# Patient Record
Sex: Female | Born: 1970 | Race: White | Hispanic: No | Smoking: Never smoker
Health system: Southern US, Community
[De-identification: ages and names within clinical notes are randomized; demographics above are authoritative.]

## PROBLEM LIST (undated history)

## (undated) DIAGNOSIS — F419 Anxiety disorder, unspecified: Secondary | ICD-10-CM

## (undated) HISTORY — PX: APPENDECTOMY: SHX54

## (undated) HISTORY — PX: BREAST ENHANCEMENT SURGERY: SHX7

---

## 1998-09-29 ENCOUNTER — Emergency Department (HOSPITAL_COMMUNITY): Admission: EM | Admit: 1998-09-29 | Discharge: 1998-09-29 | Payer: Self-pay | Admitting: Internal Medicine

## 1999-01-23 ENCOUNTER — Ambulatory Visit (HOSPITAL_COMMUNITY): Admission: RE | Admit: 1999-01-23 | Discharge: 1999-01-23 | Payer: Self-pay | Admitting: Obstetrics and Gynecology

## 1999-01-23 ENCOUNTER — Encounter: Payer: Self-pay | Admitting: Obstetrics and Gynecology

## 1999-06-12 ENCOUNTER — Inpatient Hospital Stay (HOSPITAL_COMMUNITY): Admission: AD | Admit: 1999-06-12 | Discharge: 1999-06-12 | Payer: Self-pay | Admitting: Obstetrics and Gynecology

## 1999-06-19 ENCOUNTER — Inpatient Hospital Stay (HOSPITAL_COMMUNITY): Admission: AD | Admit: 1999-06-19 | Discharge: 1999-06-19 | Payer: Self-pay | Admitting: Obstetrics and Gynecology

## 1999-06-24 ENCOUNTER — Inpatient Hospital Stay (HOSPITAL_COMMUNITY): Admission: AD | Admit: 1999-06-24 | Discharge: 1999-06-26 | Payer: Self-pay | Admitting: Obstetrics and Gynecology

## 1999-06-25 ENCOUNTER — Encounter (INDEPENDENT_AMBULATORY_CARE_PROVIDER_SITE_OTHER): Payer: Self-pay | Admitting: Specialist

## 2000-03-17 ENCOUNTER — Other Ambulatory Visit: Admission: RE | Admit: 2000-03-17 | Discharge: 2000-03-17 | Payer: Self-pay | Admitting: Gynecology

## 2001-04-14 ENCOUNTER — Other Ambulatory Visit: Admission: RE | Admit: 2001-04-14 | Discharge: 2001-04-14 | Payer: Self-pay | Admitting: Gynecology

## 2002-04-26 ENCOUNTER — Other Ambulatory Visit: Admission: RE | Admit: 2002-04-26 | Discharge: 2002-04-26 | Payer: Self-pay | Admitting: Gynecology

## 2002-05-08 ENCOUNTER — Encounter: Admission: RE | Admit: 2002-05-08 | Discharge: 2002-05-08 | Payer: Self-pay | Admitting: Sports Medicine

## 2003-07-01 ENCOUNTER — Other Ambulatory Visit: Admission: RE | Admit: 2003-07-01 | Discharge: 2003-07-01 | Payer: Self-pay | Admitting: Gynecology

## 2004-07-07 ENCOUNTER — Other Ambulatory Visit: Admission: RE | Admit: 2004-07-07 | Discharge: 2004-07-07 | Payer: Self-pay | Admitting: Gynecology

## 2005-01-01 ENCOUNTER — Ambulatory Visit: Payer: Self-pay | Admitting: Sports Medicine

## 2005-01-19 ENCOUNTER — Ambulatory Visit: Payer: Self-pay | Admitting: Sports Medicine

## 2005-04-22 ENCOUNTER — Ambulatory Visit: Payer: Self-pay | Admitting: Sports Medicine

## 2005-05-04 ENCOUNTER — Ambulatory Visit: Payer: Self-pay | Admitting: Sports Medicine

## 2005-08-26 ENCOUNTER — Other Ambulatory Visit: Admission: RE | Admit: 2005-08-26 | Discharge: 2005-08-26 | Payer: Self-pay | Admitting: Gynecology

## 2006-08-30 ENCOUNTER — Other Ambulatory Visit: Admission: RE | Admit: 2006-08-30 | Discharge: 2006-08-30 | Payer: Self-pay | Admitting: Gynecology

## 2007-12-19 ENCOUNTER — Other Ambulatory Visit: Admission: RE | Admit: 2007-12-19 | Discharge: 2007-12-19 | Payer: Self-pay | Admitting: Gynecology

## 2011-02-06 ENCOUNTER — Emergency Department (HOSPITAL_COMMUNITY)
Admission: EM | Admit: 2011-02-06 | Discharge: 2011-02-06 | Disposition: A | Discharge status: Home / Self Care | Payer: Self-pay | Attending: Emergency Medicine | Admitting: Emergency Medicine

## 2011-02-06 ENCOUNTER — Emergency Department (HOSPITAL_COMMUNITY): Payer: Self-pay

## 2011-02-06 DIAGNOSIS — R002 Palpitations: Secondary | ICD-10-CM | POA: Insufficient documentation

## 2011-02-06 DIAGNOSIS — R110 Nausea: Secondary | ICD-10-CM | POA: Insufficient documentation

## 2011-02-06 DIAGNOSIS — R079 Chest pain, unspecified: Secondary | ICD-10-CM | POA: Insufficient documentation

## 2011-02-06 DIAGNOSIS — R5381 Other malaise: Secondary | ICD-10-CM | POA: Insufficient documentation

## 2011-02-06 LAB — POCT CARDIAC MARKERS
CKMB, poc: <1 ng/mL — ABNORMAL LOW (ref 1.0–8.0)
Myoglobin, poc: 53.5 ng/mL (ref 12–200)
Troponin i, poc: <0.05 ng/mL (ref 0.00–0.09)

## 2011-02-06 LAB — POCT I-STAT, CHEM 8
BUN: 18 mg/dL (ref 6–23)
Calcium, Ion: 1.15 mmol/L (ref 1.12–1.32)
Chloride: 109 mEq/L (ref 96–112)
Creatinine, Ser: 0.9 mg/dL (ref 0.4–1.2)
Glucose, Bld: 91 mg/dL (ref 70–99)
HCT: 46 % (ref 36.0–46.0)
Hemoglobin: 15.6 g/dL — ABNORMAL HIGH (ref 12.0–15.0)
Potassium: 3.8 mEq/L (ref 3.5–5.1)
Sodium: 140 mEq/L (ref 135–145)
TCO2: 21 mmol/L (ref 0–100)

## 2011-02-06 LAB — POCT PREGNANCY, URINE: Preg Test, Ur: NEGATIVE

## 2011-02-06 LAB — URINALYSIS, ROUTINE W REFLEX MICROSCOPIC
Ketones, ur: NEGATIVE mg/dL
Nitrite: NEGATIVE
Protein, ur: NEGATIVE mg/dL
Urobilinogen, UA: 0.2 mg/dL (ref 0.0–1.0)

## 2011-02-06 LAB — COMPREHENSIVE METABOLIC PANEL
Albumin: 3.8 g/dL (ref 3.5–5.2)
BUN: 18 mg/dL (ref 6–23)
Calcium: 9.2 mg/dL (ref 8.4–10.5)
Chloride: 108 mEq/L (ref 96–112)
Creatinine, Ser: 0.81 mg/dL (ref 0.4–1.2)
Total Bilirubin: 0.8 mg/dL (ref 0.3–1.2)

## 2011-02-06 LAB — D-DIMER, QUANTITATIVE: D-Dimer, Quant: 0.26 ug/mL-FEU (ref 0.00–0.48)

## 2011-02-07 ENCOUNTER — Emergency Department (HOSPITAL_COMMUNITY)
Admission: EM | Admit: 2011-02-07 | Discharge: 2011-02-07 | Disposition: A | Discharge status: Home / Self Care | Payer: Self-pay | Attending: Emergency Medicine | Admitting: Emergency Medicine

## 2011-02-07 DIAGNOSIS — R002 Palpitations: Secondary | ICD-10-CM | POA: Insufficient documentation

## 2011-02-07 DIAGNOSIS — I4949 Other premature depolarization: Secondary | ICD-10-CM | POA: Insufficient documentation

## 2011-02-07 DIAGNOSIS — R072 Precordial pain: Secondary | ICD-10-CM | POA: Insufficient documentation

## 2011-02-07 DIAGNOSIS — Z79899 Other long term (current) drug therapy: Secondary | ICD-10-CM | POA: Insufficient documentation

## 2011-02-07 DIAGNOSIS — F411 Generalized anxiety disorder: Secondary | ICD-10-CM | POA: Insufficient documentation

## 2011-02-07 LAB — TSH: TSH: 1.03 u[IU]/mL (ref 0.350–4.500)

## 2011-02-07 LAB — T4, FREE: Free T4: 1.07 ng/dL (ref 0.80–1.80)

## 2011-02-09 ENCOUNTER — Institutional Professional Consult (permissible substitution): Payer: Self-pay | Admitting: Cardiology

## 2011-11-29 ENCOUNTER — Encounter: Payer: Self-pay | Admitting: Family Medicine

## 2011-11-29 ENCOUNTER — Ambulatory Visit (HOSPITAL_BASED_OUTPATIENT_CLINIC_OR_DEPARTMENT_OTHER)
Admission: RE | Admit: 2011-11-29 | Discharge: 2011-11-29 | Disposition: A | Discharge status: Home / Self Care | Payer: BC Managed Care – PPO | Source: Ambulatory Visit | Attending: Family Medicine | Admitting: Family Medicine

## 2011-11-29 ENCOUNTER — Ambulatory Visit (INDEPENDENT_AMBULATORY_CARE_PROVIDER_SITE_OTHER): Payer: BC Managed Care – PPO | Admitting: Family Medicine

## 2011-11-29 VITALS — BP 115/72 | HR 75 | Temp 98.2°F | Ht 66.0 in | Wt 135.0 lb

## 2011-11-29 DIAGNOSIS — M25511 Pain in right shoulder: Secondary | ICD-10-CM

## 2011-11-29 DIAGNOSIS — M25519 Pain in unspecified shoulder: Secondary | ICD-10-CM

## 2011-11-29 NOTE — Progress Notes (Signed)
  Subjective:    Patient ID: Linda Robertson, female    DOB: Jan 07, 1971, 40 y.o.   MRN: [id number]  HPI 40 yo F here for right shoulder pain.  Patient denies known injury. States pain started on 11/29 during work. Worsened over that day to the point she couldn't lift her arm from desk because of severe pain. Did not do any heavy lifting or anything out of the ordinary that day. Since then is 9/10 pain. Tried icing, heating pad, advil without much help. Is waking up at night. Reports a nagging ache in this shoulder for several years. Uses left hand more than right at work though. No radiation. No numbness/tingling. No remote right shoulder injuries she can recall.  History reviewed. No pertinent past medical history.  No current outpatient prescriptions on file prior to visit.    Past Surgical History  Procedure Date  . Appendectomy   . Breast enhancement surgery     Allergies  Allergen Reactions  . Codeine     History   Social History  . Marital Status: Single    Spouse Name: N/A    Number of Children: N/A  . Years of Education: N/A   Occupational History  . Not on file.   Social History Main Topics  . Smoking status: Never Smoker   . Smokeless tobacco: Not on file  . Alcohol Use: Not on file  . Drug Use: Not on file  . Sexually Active: Not on file   Other Topics Concern  . Not on file   Social History Narrative  . No narrative on file    Family History  Problem Relation Age of Onset  . Heart attack Father   . Sudden death Father   . Heart attack Maternal Grandmother   . Heart attack Maternal Grandfather   . Heart attack Paternal Grandmother   . Diabetes Neg Hx   . Hyperlipidemia Neg Hx   . Hypertension Neg Hx     BP 115/72  Pulse 75  Temp(Src) 98.2 F (36.8 C) (Oral)  Ht 5\' 6"  (1.676 m)  Wt 135 lb (61.236 kg)  BMI 21.79 kg/m2  LMP 11/08/2011  Review of Systems See HPI.    Objective:   Physical Exam Gen: NAD  R shoulder: No  swelling, ecchymoses.  No gross deformity. TTP lateral right shoulder and anteriorly but not at [facility] joint or biceps tendon. Very limited ROM including passive ER - only 20 degrees and painful.  AROM limited to 20 ER, 60 abd/flex with 100 passive flex, 80 abd. Positive hawkins, neers. Negative Yergasons. Unable to adequately perform empty can.  Pain with this and resisted IR and ER - decreased strength in all 2/2 pain. NV intact distally.     Assessment & Plan:  1. Right shoulder pain - x-rays show calcific tendinopathy - exam also indicates developing severe adhesive capsulitis.  Without injury highly unlikely she has rotator cuff tear.  Start with conservative care - combination subacromial/glenohumeral injection given today.  Start codman exercises.  Heat to help regain motion as well.  Consider PT in future.  After informed written consent, patient was seated on exam table. Right shoulder was prepped with alcohol swab and utilizing posterior approach, patient's right shoulder was injected with 6:2 marcaine:depomedrol with half in the subacromial space and half in glenohumeral space.  Patient tolerated the procedure well without immediate complications.

## 2011-11-29 NOTE — Patient Instructions (Signed)
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 Limit lifting and overhead activities as much as possible. Heat 15 minutes at a time 3-4 times a day may help with movement and stiffness. Tylenol and/or aleve as needed for pain and inflammation. Steroid injections in a series have been shown to help with pain and motion. Codman exercises (pendulum, wall walking or table slides, arm circles) - do 3 sets of 10 daily of each of these. Physical therapy for rotator cuff strengthening is a consideration once you are out of the painful phase Follow up in 4 weeks with [place] or in [place]. Call me if you don't notice a benefit in a week - we could try passive motion and modalities in PT or nitro patches - I wouldn't repeat a cortisone injection this soon though.

## 2011-11-29 NOTE — Assessment & Plan Note (Signed)
x-rays show calcific tendinopathy - exam also indicates developing severe adhesive capsulitis.  Without injury highly unlikely she has rotator cuff tear.  Start with conservative care - combination subacromial/glenohumeral injection given today.  Start codman exercises.  Heat to help regain motion as well.  Consider PT in future.  After informed written consent, patient was seated on exam table. Right shoulder was prepped with alcohol swab and utilizing posterior approach, patient's right shoulder was injected with 6:2 marcaine:depomedrol with half in the subacromial space and half in glenohumeral space.  Patient tolerated the procedure well without immediate complications.

## 2011-12-06 ENCOUNTER — Ambulatory Visit (INDEPENDENT_AMBULATORY_CARE_PROVIDER_SITE_OTHER): Payer: BC Managed Care – PPO | Admitting: Family Medicine

## 2011-12-06 ENCOUNTER — Ambulatory Visit (HOSPITAL_BASED_OUTPATIENT_CLINIC_OR_DEPARTMENT_OTHER)
Admission: RE | Admit: 2011-12-06 | Discharge: 2011-12-06 | Disposition: A | Discharge status: Home / Self Care | Payer: BC Managed Care – PPO | Source: Ambulatory Visit | Attending: Family Medicine | Admitting: Family Medicine

## 2011-12-06 ENCOUNTER — Encounter: Payer: Self-pay | Admitting: Family Medicine

## 2011-12-06 VITALS — BP 119/62 | HR 74 | Temp 98.3°F | Ht 66.0 in | Wt 135.0 lb

## 2011-12-06 DIAGNOSIS — S99919A Unspecified injury of unspecified ankle, initial encounter: Secondary | ICD-10-CM

## 2011-12-06 DIAGNOSIS — S99912A Unspecified injury of left ankle, initial encounter: Secondary | ICD-10-CM | POA: Insufficient documentation

## 2011-12-06 DIAGNOSIS — X58XXXA Exposure to other specified factors, initial encounter: Secondary | ICD-10-CM | POA: Insufficient documentation

## 2011-12-06 DIAGNOSIS — S82899A Other fracture of unspecified lower leg, initial encounter for closed fracture: Secondary | ICD-10-CM | POA: Insufficient documentation

## 2011-12-06 DIAGNOSIS — S99929A Unspecified injury of unspecified foot, initial encounter: Secondary | ICD-10-CM

## 2011-12-06 NOTE — Progress Notes (Signed)
  Subjective:    Patient ID: Linda Robertson, female    DOB: 12-18-1971, 40 y.o.   MRN: [id number]  Ankle Injury    40 yo F here for left ankle injury   Patient reports on 12/8 she was at her office christmas party. While leaving she states she acutely turned her ankle (unsure which direction) while wearing heels. Unable to bear weight immediately after. Had pain, swelling, bruising develop laterally.  No medial pain. Has history of ankle sprains remotely. Has tried icing, elevating, advil. Can walk better today but still with difficulty.  History reviewed. No pertinent past medical history.  Current Outpatient Prescriptions on File Prior to Visit  Medication Sig Dispense Refill  . ALPRAZolam (XANAX) 0.25 MG tablet Take 0.25 mg by mouth at bedtime as needed.        . sertraline (ZOLOFT) 50 MG tablet Take 50 mg by mouth daily.          Past Surgical History  Procedure Date  . Appendectomy   . Breast enhancement surgery     Allergies  Allergen Reactions  . Codeine     History   Social History  . Marital Status: Single    Spouse Name: N/A    Number of Children: N/A  . Years of Education: N/A   Occupational History  . Not on file.   Social History Main Topics  . Smoking status: Never Smoker   . Smokeless tobacco: Not on file  . Alcohol Use: Not on file  . Drug Use: Not on file  . Sexually Active: Not on file   Other Topics Concern  . Not on file   Social History Narrative  . No narrative on file    Family History  Problem Relation Age of Onset  . Heart attack Father   . Sudden death Father   . Heart attack Maternal Grandmother   . Heart attack Maternal Grandfather   . Heart attack Paternal Grandmother   . Diabetes Neg Hx   . Hyperlipidemia Neg Hx   . Hypertension Neg Hx     BP 119/62  Pulse 74  Temp(Src) 98.3 F (36.8 C) (Oral)  Ht 5\' 6"  (1.676 m)  Wt 135 lb (61.236 kg)  BMI 21.79 kg/m2  LMP 11/08/2011  Review of Systems  See HPI.      Objective:   Physical Exam  Gen: NAD  L ankle: Moderate swelling, ecchymoses mostly laterally.  No warmth. Moderate limitation in all planes 2/2 pain and swelling. TTP at ATFL and lateral malleolus.  No medial malleolus, fibular head, navicular, base 5th, other TTP about foot or ankle.  No TTP over deltoid ligament medially. 1+ ant drawer, painful.  1+ talar tilt. Lateral pain with syndesmotic compression. Thompsons test negative. NV intact distally.     Assessment & Plan:  1. Left ankle injury - radiographs show a distal fibula fracture above level of ankle joint consistent with a Weber C type fracture.  This is very commonly associated with syndesmotic dysruption, deltoid ligament tear but patient does not have medial pain and her mortise appears intact.  Patient placed in walking boot and to use crutches with touch-down weight bearing over next 7-10 days.  Will follow-up in office, repeat radiographs and if mortise intact on these, will do a medial stress view.  Have a low threshold for orthopedic surgeon referral given high incidence of associated injuries with this type of fracture that would require ORIF.

## 2011-12-06 NOTE — Assessment & Plan Note (Signed)
radiographs show a distal fibula fracture above level of ankle joint consistent with a Weber C type fracture.  This is very commonly associated with syndesmotic dysruption, deltoid ligament tear but patient does not have medial pain and her mortise appears intact.  Patient placed in walking boot and to use crutches with touch-down weight bearing over next 7-10 days.  Will follow-up in office, repeat radiographs and if mortise intact on these, will do a medial stress view.  Have a low threshold for orthopedic surgeon referral given high incidence of associated injuries with this type of fracture that would require ORIF.

## 2011-12-06 NOTE — Patient Instructions (Signed)
You have an isolated oblique distal fibula fracture though your ankle joint appears maintained. We will have to keep a close watch on this because of the fracture's location above the ankle joint. Wear walking boot except when icing, bathing (be VERY careful), driving - though do not push pedal with your left leg. ACE wrap under walking boot can help with compression as well. Elevate above the level of your heart as much as possible. Use crutches with touch-down weight bearing of your left ankle. Follow up with me in 7-10 days - we will repeat x-rays at that time.

## 2011-12-07 MED ORDER — DICLOFENAC SODIUM 75 MG PO TBEC: 75.0000 mg | DELAYED_RELEASE_TABLET | Freq: Two times a day (BID) | ORAL | Status: DC

## 2011-12-13 ENCOUNTER — Encounter: Payer: Self-pay | Admitting: Family Medicine

## 2011-12-13 ENCOUNTER — Ambulatory Visit (INDEPENDENT_AMBULATORY_CARE_PROVIDER_SITE_OTHER): Payer: BC Managed Care – PPO | Admitting: Family Medicine

## 2011-12-13 ENCOUNTER — Ambulatory Visit (INDEPENDENT_AMBULATORY_CARE_PROVIDER_SITE_OTHER)
Admission: RE | Admit: 2011-12-13 | Discharge: 2011-12-13 | Disposition: A | Discharge status: Home / Self Care | Payer: BC Managed Care – PPO | Source: Ambulatory Visit | Attending: Family Medicine | Admitting: Family Medicine

## 2011-12-13 ENCOUNTER — Ambulatory Visit (HOSPITAL_BASED_OUTPATIENT_CLINIC_OR_DEPARTMENT_OTHER)
Admission: RE | Admit: 2011-12-13 | Discharge: 2011-12-13 | Disposition: A | Discharge status: Home / Self Care | Payer: BC Managed Care – PPO | Source: Ambulatory Visit | Attending: Family Medicine | Admitting: Family Medicine

## 2011-12-13 ENCOUNTER — Other Ambulatory Visit: Payer: Self-pay | Admitting: Family Medicine

## 2011-12-13 VITALS — BP 115/81 | HR 86 | Temp 97.4°F | Ht 66.0 in | Wt 135.0 lb

## 2011-12-13 DIAGNOSIS — S8990XA Unspecified injury of unspecified lower leg, initial encounter: Secondary | ICD-10-CM

## 2011-12-13 DIAGNOSIS — M25579 Pain in unspecified ankle and joints of unspecified foot: Secondary | ICD-10-CM

## 2011-12-13 DIAGNOSIS — M25572 Pain in left ankle and joints of left foot: Secondary | ICD-10-CM

## 2011-12-13 DIAGNOSIS — R52 Pain, unspecified: Secondary | ICD-10-CM

## 2011-12-13 DIAGNOSIS — X58XXXA Exposure to other specified factors, initial encounter: Secondary | ICD-10-CM

## 2011-12-13 DIAGNOSIS — IMO0001 Reserved for inherently not codable concepts without codable children: Secondary | ICD-10-CM | POA: Insufficient documentation

## 2011-12-13 DIAGNOSIS — S82899A Other fracture of unspecified lower leg, initial encounter for closed fracture: Secondary | ICD-10-CM

## 2011-12-13 DIAGNOSIS — Z4789 Encounter for other orthopedic aftercare: Secondary | ICD-10-CM

## 2011-12-13 DIAGNOSIS — S99912A Unspecified injury of left ankle, initial encounter: Secondary | ICD-10-CM

## 2011-12-13 NOTE — Assessment & Plan Note (Signed)
repeat radiographs with medial stress views show no evidence of displacement or medial or syndesmotic disruption.  Still consistent with Weber C fracture and needs close follow-up but without mortise disruption, should do well with conservative treatment.  Continue walking boot and crutches.  Icing, voltaren, tylenol as needed.  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

## 2011-12-13 NOTE — Patient Instructions (Signed)
Your fracture looks good - no further displacement and stress views do not show tearing of the ligaments on the inside of the ankle. Continue voltaren as needed - can take tylenol in addition to this. Half days at work for next 2 weeks to allow relative rest of this. Can take boot off to sleep if not painful, to ice, and to bathe as long as you're careful. Follow up with me in 2 weeks for recheck and repeat x-rays. Call me if you need something stronger for pain.

## 2011-12-13 NOTE — Progress Notes (Signed)
Subjective:    Patient ID: Linda Robertson, female    DOB: 29-Nov-1971, 40 y.o.   MRN: [id number]  Ankle Injury    40 yo F here for f/u left ankle injury   12/10: Patient reports on 12/8 she was at her office christmas party. While leaving she states she acutely turned her ankle (unsure which direction) while wearing heels. Unable to bear weight immediately after. Had pain, swelling, bruising develop laterally.  No medial pain. Has history of ankle sprains remotely. Has tried icing, elevating, advil. Can walk better today but still with difficulty.  12/17: Patient reports pain has improved. Swelling is down in the morning but worse as day goes on, associated with bruising as well. Throbs more at nighttime when on her feet. Difficulty bearing weight comfortably at end of day. Using cam walker and crutches without difficulty. Took some voltaren but  Not taking regularly now.  History reviewed. No pertinent past medical history.  Current Outpatient Prescriptions on File Prior to Visit  Medication Sig Dispense Refill  . ALPRAZolam (XANAX) 0.25 MG tablet Take 0.25 mg by mouth at bedtime as needed.        . diclofenac (VOLTAREN) 75 MG EC tablet Take 1 tablet (75 mg total) by mouth 2 (two) times daily with a meal.  60 tablet  1  . sertraline (ZOLOFT) 50 MG tablet Take 50 mg by mouth daily.          Past Surgical History  Procedure Date  . Appendectomy   . Breast enhancement surgery     Allergies  Allergen Reactions  . Codeine     History   Social History  . Marital Status: Single    Spouse Name: N/A    Number of Children: N/A  . Years of Education: N/A   Occupational History  . Not on file.   Social History Main Topics  . Smoking status: Never Smoker   . Smokeless tobacco: Not on file  . Alcohol Use: Not on file  . Drug Use: Not on file  . Sexually Active: Not on file   Other Topics Concern  . Not on file   Social History Narrative  . No narrative on file     Family History  Problem Relation Age of Onset  . Heart attack Father   . Sudden death Father   . Heart attack Maternal Grandmother   . Heart attack Maternal Grandfather   . Heart attack Paternal Grandmother   . Diabetes Neg Hx   . Hyperlipidemia Neg Hx   . Hypertension Neg Hx     BP 115/81  Pulse 86  Temp(Src) 97.4 F (36.3 C) (Oral)  Ht 5\' 6"  (1.676 m)  Wt 135 lb (61.236 kg)  BMI 21.79 kg/m2  LMP 11/08/2011  Review of Systems  See HPI.    Objective:   Physical Exam  Gen: NAD  L ankle: Mild swelling, moderate ecchymoses throughout lower leg from mid-lower leg distally - mostly laterally.  No warmth. Mild limitation in all planes 2/2 pain and swelling. TTP at ATFL < lateral malleolus.  No medial malleolus, fibular head, navicular, base 5th, other TTP about foot or ankle.  No TTP over deltoid ligament medially. 1+ ant drawer, painful.  1+ talar tilt. Lateral pain with syndesmotic compression. Thompsons test negative. NV intact distally.     Assessment & Plan:  1. Left ankle injury - repeat radiographs with medial stress views show no evidence of displacement or medial or syndesmotic disruption.  Still consistent with Weber C fracture and needs close follow-up but without mortise disruption, should do well with conservative treatment.  Continue walking boot and crutches.  Icing, voltaren, tylenol as needed.  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

## 2011-12-14 ENCOUNTER — Ambulatory Visit: Payer: BC Managed Care – PPO | Admitting: Family Medicine

## 2011-12-29 ENCOUNTER — Encounter: Payer: BC Managed Care – PPO | Admitting: Family Medicine

## 2011-12-30 ENCOUNTER — Ambulatory Visit (INDEPENDENT_AMBULATORY_CARE_PROVIDER_SITE_OTHER): Payer: BC Managed Care – PPO | Admitting: Family Medicine

## 2011-12-30 ENCOUNTER — Encounter: Payer: Self-pay | Admitting: Family Medicine

## 2011-12-30 ENCOUNTER — Ambulatory Visit (HOSPITAL_BASED_OUTPATIENT_CLINIC_OR_DEPARTMENT_OTHER)
Admission: RE | Admit: 2011-12-30 | Discharge: 2011-12-30 | Disposition: A | Discharge status: Home / Self Care | Payer: BC Managed Care – PPO | Source: Ambulatory Visit | Attending: Family Medicine | Admitting: Family Medicine

## 2011-12-30 VITALS — BP 110/65 | HR 70 | Temp 97.5°F | Ht 66.0 in | Wt 130.0 lb

## 2011-12-30 DIAGNOSIS — M25579 Pain in unspecified ankle and joints of unspecified foot: Secondary | ICD-10-CM

## 2011-12-30 DIAGNOSIS — M25572 Pain in left ankle and joints of left foot: Secondary | ICD-10-CM

## 2011-12-30 DIAGNOSIS — S99929A Unspecified injury of unspecified foot, initial encounter: Secondary | ICD-10-CM

## 2011-12-30 DIAGNOSIS — S99912A Unspecified injury of left ankle, initial encounter: Secondary | ICD-10-CM

## 2011-12-30 NOTE — Progress Notes (Signed)
Subjective:    Patient ID: Linda Robertson, female    DOB: 07/01/1971, 41 y.o.   MRN: [id number]  Ankle Injury    41 yo F here for f/u left ankle injury   12/10: Patient reports on 12/8 she was at her office christmas party. While leaving she states she acutely turned her ankle (unsure which direction) while wearing heels. Unable to bear weight immediately after. Had pain, swelling, bruising develop laterally.  No medial pain. Has history of ankle sprains remotely. Has tried icing, elevating, advil. Can walk better today but still with difficulty.  12/17: Patient reports pain has improved. Swelling is down in the morning but worse as day goes on, associated with bruising as well. Throbs more at nighttime when on her feet. Difficulty bearing weight comfortably at end of day. Using cam walker and crutches without difficulty. Took some voltaren but  Not taking regularly now.  1/3: Overall improved quite a bit now. No longer using crutches. Still wearing cam walker when ambulatory. Not taking any medication for pain. Tender to touch at fracture site but otherwise no pain. Swelling almost completely resolved as well as bruising. No other complaints.  History reviewed. No pertinent past medical history.  Current Outpatient Prescriptions on File Prior to Visit  Medication Sig Dispense Refill  . ALPRAZolam (XANAX) 0.25 MG tablet Take 0.25 mg by mouth at bedtime as needed.        Marland Kitchen NUVARING 0.12-0.015 MG/24HR vaginal ring       . sertraline (ZOLOFT) 50 MG tablet Take 50 mg by mouth daily.          Past Surgical History  Procedure Date  . Appendectomy   . Breast enhancement surgery     Allergies  Allergen Reactions  . Codeine     History   Social History  . Marital Status: Single    Spouse Name: N/A    Number of Children: N/A  . Years of Education: N/A   Occupational History  . Not on file.   Social History Main Topics  . Smoking status: Never Smoker   .  Smokeless tobacco: Not on file  . Alcohol Use: Not on file  . Drug Use: Not on file  . Sexually Active: Not on file   Other Topics Concern  . Not on file   Social History Narrative  . No narrative on file    Family History  Problem Relation Age of Onset  . Heart attack Father   . Sudden death Father   . Heart attack Maternal Grandmother   . Heart attack Maternal Grandfather   . Heart attack Paternal Grandmother   . Diabetes Neg Hx   . Hyperlipidemia Neg Hx   . Hypertension Neg Hx     BP 110/65  Pulse 70  Temp(Src) 97.5 F (36.4 C) (Oral)  Ht 5\' 6"  (1.676 m)  Wt 130 lb (58.968 kg)  BMI 20.98 kg/m2  Review of Systems  See HPI.    Objective:   Physical Exam  Gen: NAD  L ankle: Minimal swelling, minimal ecchymoses lateral lower leg. Minimal limitation in all planes 2/2 pain and swelling. Mild TTP at lateral malleolus.  No medial malleolus, fibular head, navicular, base 5th, other TTP about foot or ankle.  No TTP over deltoid ligament medially. Ant drawer and talar tilt not tested today. Lateral pain with syndesmotic compression. NV intact distally.    Assessment & Plan:  1. Left ankle injury - repeat radiographs again show no  evidence of syndesmotic or medial disruption.  Some interval healing at fracture site and she has clinically improved as well.  Still believe this will be 6-8 weeks total before healed.  Again discussed needs close follow-up - to return in 2-3 weeks for repeat radiographs and clinical evaluation.  Continue cam walker.  Icing, voltaren, tylenol prn.

## 2011-12-30 NOTE — Assessment & Plan Note (Signed)
repeat radiographs again show no evidence of syndesmotic or medial disruption.  Some interval healing at fracture site and she has clinically improved as well.  Still believe this will be 6-8 weeks total before healed.  Again discussed needs close follow-up - to return in 2-3 weeks for repeat radiographs and clinical evaluation.  Continue cam walker.  Icing, voltaren, tylenol prn.

## 2011-12-30 NOTE — Progress Notes (Signed)
Patient ID: Linda Robertson, female   DOB: [birth year], 41 y.o.   MRN: [id number] Note: Patient did not arrive for this visit - encounter opened in error.

## 2012-01-17 ENCOUNTER — Ambulatory Visit (INDEPENDENT_AMBULATORY_CARE_PROVIDER_SITE_OTHER): Payer: BC Managed Care – PPO | Admitting: Family Medicine

## 2012-01-17 ENCOUNTER — Encounter: Payer: Self-pay | Admitting: Family Medicine

## 2012-01-17 ENCOUNTER — Ambulatory Visit (HOSPITAL_BASED_OUTPATIENT_CLINIC_OR_DEPARTMENT_OTHER)
Admission: RE | Admit: 2012-01-17 | Discharge: 2012-01-17 | Disposition: A | Discharge status: Home / Self Care | Payer: BC Managed Care – PPO | Source: Ambulatory Visit | Attending: Family Medicine | Admitting: Family Medicine

## 2012-01-17 VITALS — BP 97/67 | HR 69 | Temp 97.4°F | Ht 67.0 in | Wt 130.0 lb

## 2012-01-17 DIAGNOSIS — S99912A Unspecified injury of left ankle, initial encounter: Secondary | ICD-10-CM

## 2012-01-17 DIAGNOSIS — Z09 Encounter for follow-up examination after completed treatment for conditions other than malignant neoplasm: Secondary | ICD-10-CM

## 2012-01-17 DIAGNOSIS — S82899A Other fracture of unspecified lower leg, initial encounter for closed fracture: Secondary | ICD-10-CM

## 2012-01-17 DIAGNOSIS — S8990XA Unspecified injury of unspecified lower leg, initial encounter: Secondary | ICD-10-CM | POA: Insufficient documentation

## 2012-01-17 DIAGNOSIS — S99919A Unspecified injury of unspecified ankle, initial encounter: Secondary | ICD-10-CM

## 2012-01-17 DIAGNOSIS — X58XXXA Exposure to other specified factors, initial encounter: Secondary | ICD-10-CM | POA: Insufficient documentation

## 2012-01-17 NOTE — Progress Notes (Signed)
Subjective:    Patient ID: Linda Robertson, female    DOB: 1971/02/27, 41 y.o.   MRN: [id number]  Ankle Injury    41 yo F here for f/u left ankle injury   12/10: Patient reports on 12/8 she was at her office christmas party. While leaving she states she acutely turned her ankle (unsure which direction) while wearing heels. Unable to bear weight immediately after. Had pain, swelling, bruising develop laterally.  No medial pain. Has history of ankle sprains remotely. Has tried icing, elevating, advil. Can walk better today but still with difficulty.  12/17: Patient reports pain has improved. Swelling is down in the morning but worse as day goes on, associated with bruising as well. Throbs more at nighttime when on her feet. Difficulty bearing weight comfortably at end of day. Using cam walker and crutches without difficulty. Took some voltaren but  Not taking regularly now.  1/3: Overall improved quite a bit now. No longer using crutches. Still wearing cam walker when ambulatory. Not taking any medication for pain. Tender to touch at fracture site but otherwise no pain. Swelling almost completely resolved as well as bruising. No other complaints.  1/21: Patient now over 6 weeks out from lateral malleolus fracture and doing well. No longer using cam walker. Not taking anything for pain. Gets sore at end of day with lots of walking and if presses on area. Also swells up at end of day. Feels much better. Hasn't tried any cardio exercise yet.  History reviewed. No pertinent past medical history.  Current Outpatient Prescriptions on File Prior to Visit  Medication Sig Dispense Refill  . ALPRAZolam (XANAX) 0.25 MG tablet Take 0.25 mg by mouth at bedtime as needed.        Marland Kitchen NUVARING 0.12-0.015 MG/24HR vaginal ring       . sertraline (ZOLOFT) 50 MG tablet Take 50 mg by mouth daily.          Past Surgical History  Procedure Date  . Appendectomy   . Breast enhancement  surgery     Allergies  Allergen Reactions  . Codeine     History   Social History  . Marital Status: Single    Spouse Name: N/A    Number of Children: N/A  . Years of Education: N/A   Occupational History  . Not on file.   Social History Main Topics  . Smoking status: Never Smoker   . Smokeless tobacco: Not on file  . Alcohol Use: Not on file  . Drug Use: Not on file  . Sexually Active: Not on file   Other Topics Concern  . Not on file   Social History Narrative  . No narrative on file    Family History  Problem Relation Age of Onset  . Heart attack Father   . Sudden death Father   . Heart attack Maternal Grandmother   . Heart attack Maternal Grandfather   . Heart attack Paternal Grandmother   . Diabetes Neg Hx   . Hyperlipidemia Neg Hx   . Hypertension Neg Hx     BP 97/67  Pulse 69  Temp(Src) 97.4 F (36.3 C) (Oral)  Ht 5\' 7"  (1.702 m)  Wt 130 lb (58.968 kg)  BMI 20.36 kg/m2  Review of Systems  See HPI.    Objective:   Physical Exam  Gen: NAD  L ankle: Minimal swelling, no ecchymoses lateral lower leg. Minimal limitation in all planes 2/2 pain and swelling. Mild TTP at lateral  malleolus.  No medial malleolus, fibular head, navicular, base 5th, other TTP about foot or ankle.  No TTP over deltoid ligament medially. Ant drawer and talar tilt negative. Lateral pain with syndesmotic compression. NV intact distally.    Assessment & Plan:  1. Left ankle injury - 2/2 Weber C fracture - significantly improved with clinical healing and callus formation on radiographs.  Stopped cam walker.  Not requiring any medications for pain.  Start back with low resistance cycling and swimming over next 1-2 weeks, then add in walking/elliptical if doing well.  No running for at least 2 more weeks (reviewed return to running program today). Ice after activities.  F/u prn.

## 2012-01-17 NOTE — Assessment & Plan Note (Signed)
2/2 Weber C fracture - significantly improved with clinical healing and callus formation on radiographs.  Stopped cam walker.  Not requiring any medications for pain.  Start back with low resistance cycling and swimming over next 1-2 weeks, then add in walking/elliptical if doing well.  No running for at least 2 more weeks (reviewed return to running program today). Ice after activities.  F/u prn.

## 2012-01-17 NOTE — Patient Instructions (Signed)
Your x-rays look great - you have good callus formation at the fracture site. This is not 100% healed though. For the next 1-2 weeks ok to bike and swim for exercises - start with low resistance on the bike. After 1-2 weeks, can add elliptical and walking for exercise. Ok to run in 2 weeks+ if you do ok with the above. Start with walk: jog 1 minute: 1 minute, total 10-15 minutes of exercise - if you do well with this, can increase jog portion by 1-2 minutes and total exercise time by 5 minutes each session. Ice for 15 minutes after working out. Continue home theraband exercises for your shoulder most days of the week. Consider formal physical therapy for this (you can call me if you want to go ahead with this). We could repeat cortisone injection but I'd like to avoid this if we can.

## 2012-09-14 IMAGING — CR DG ANKLE COMPLETE 3+V*L*
3 series · 3 of 3 positions shown · non-contrast
Comparison: None

CLINICAL DATA: Left ankle injury.

LEFT ANKLE COMPLETE - 3+ VIEW

[t ankle joint ap left]
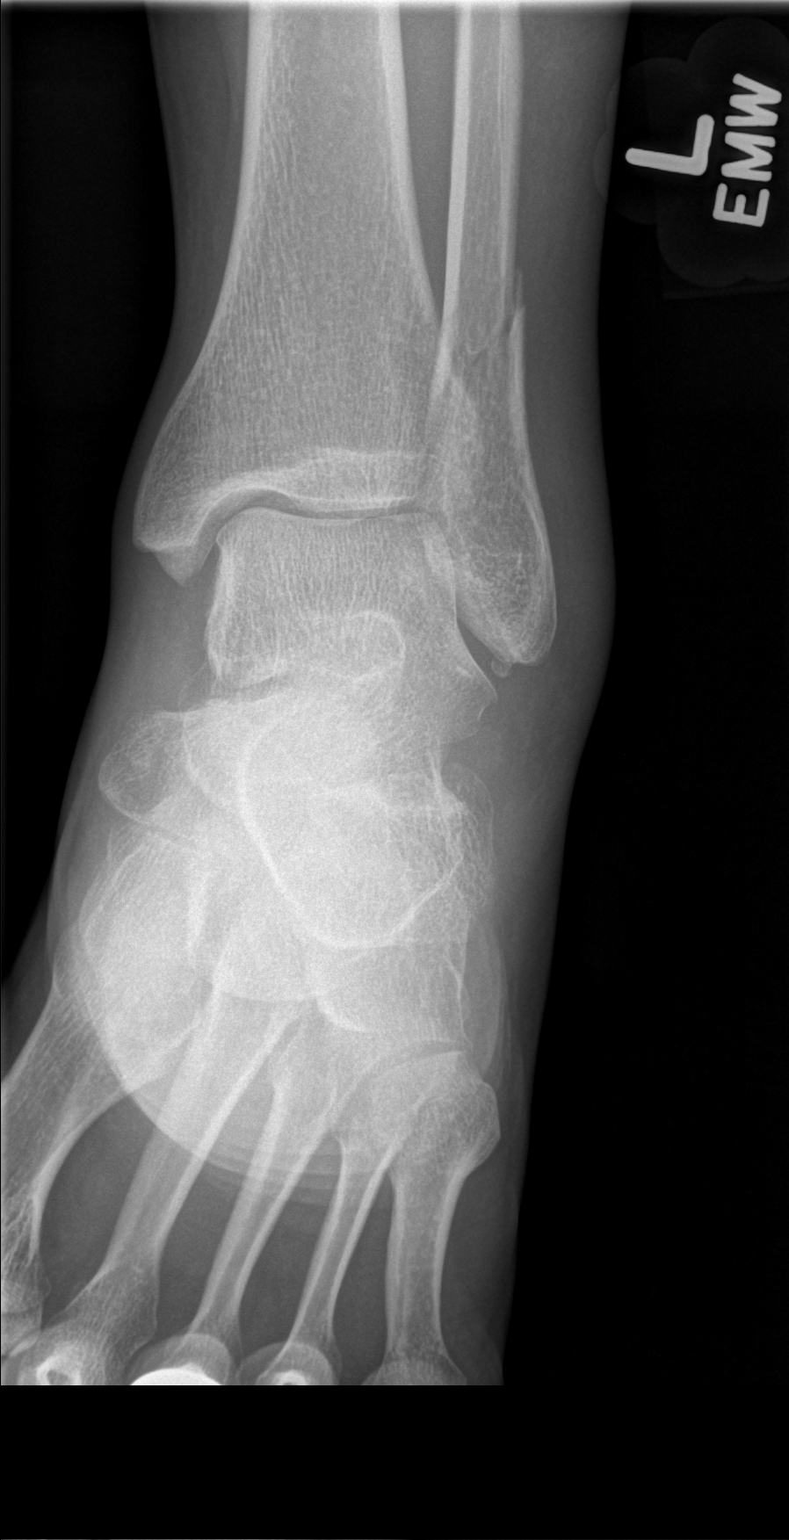

[t ankle joint oblique left]
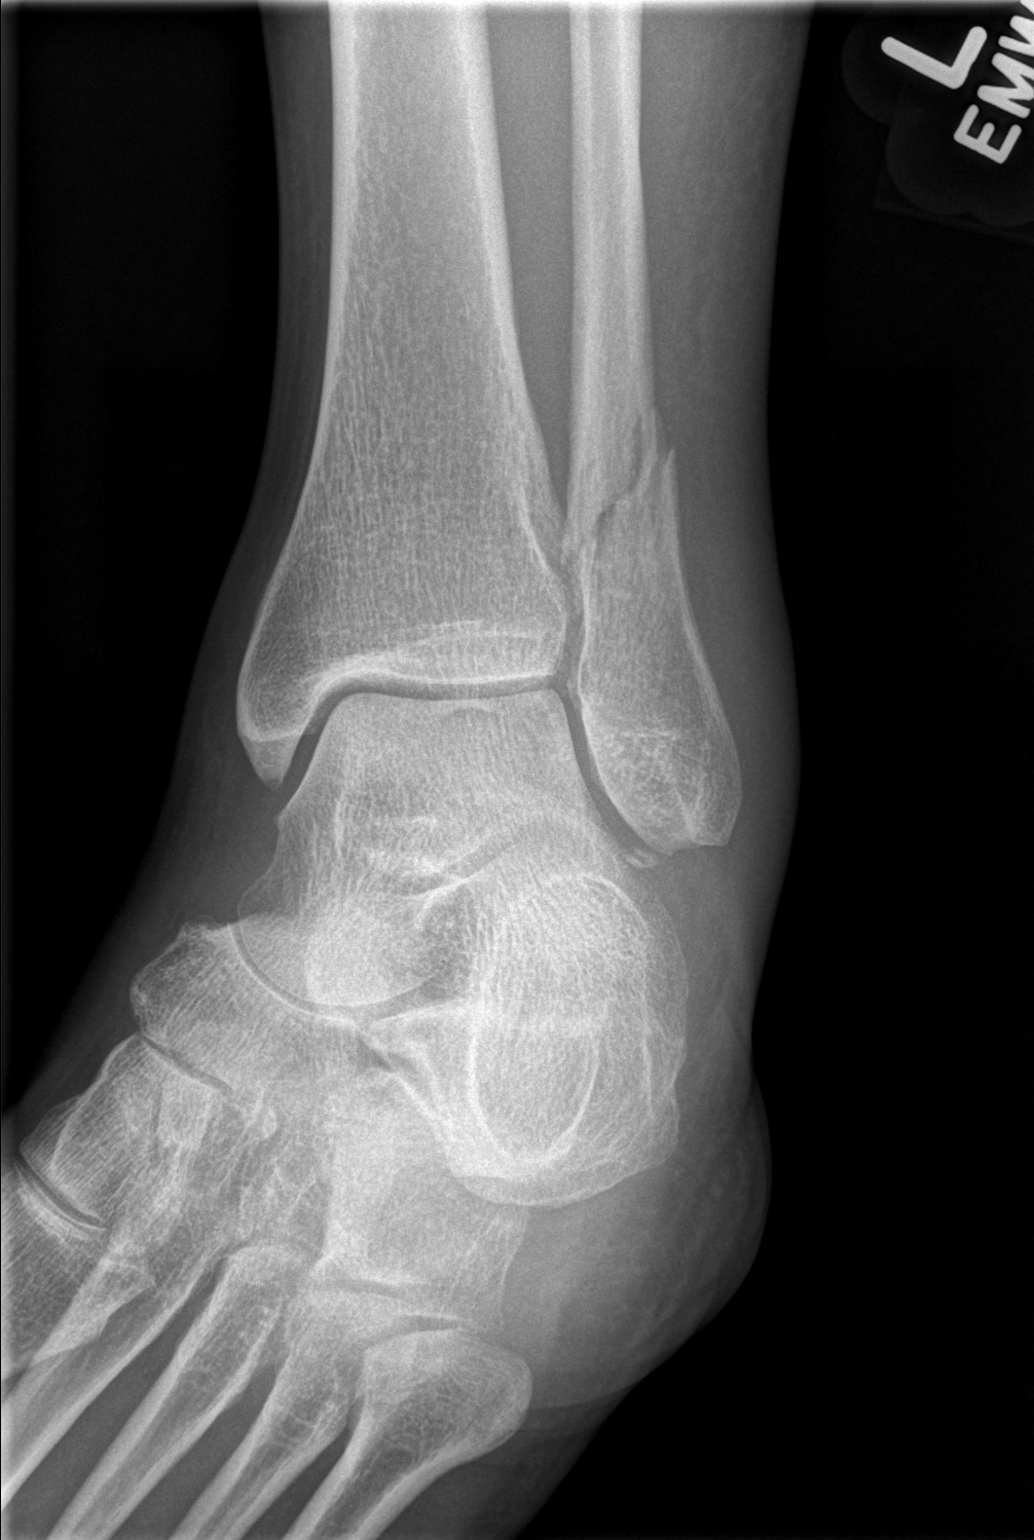

[t ankle joint lat left]
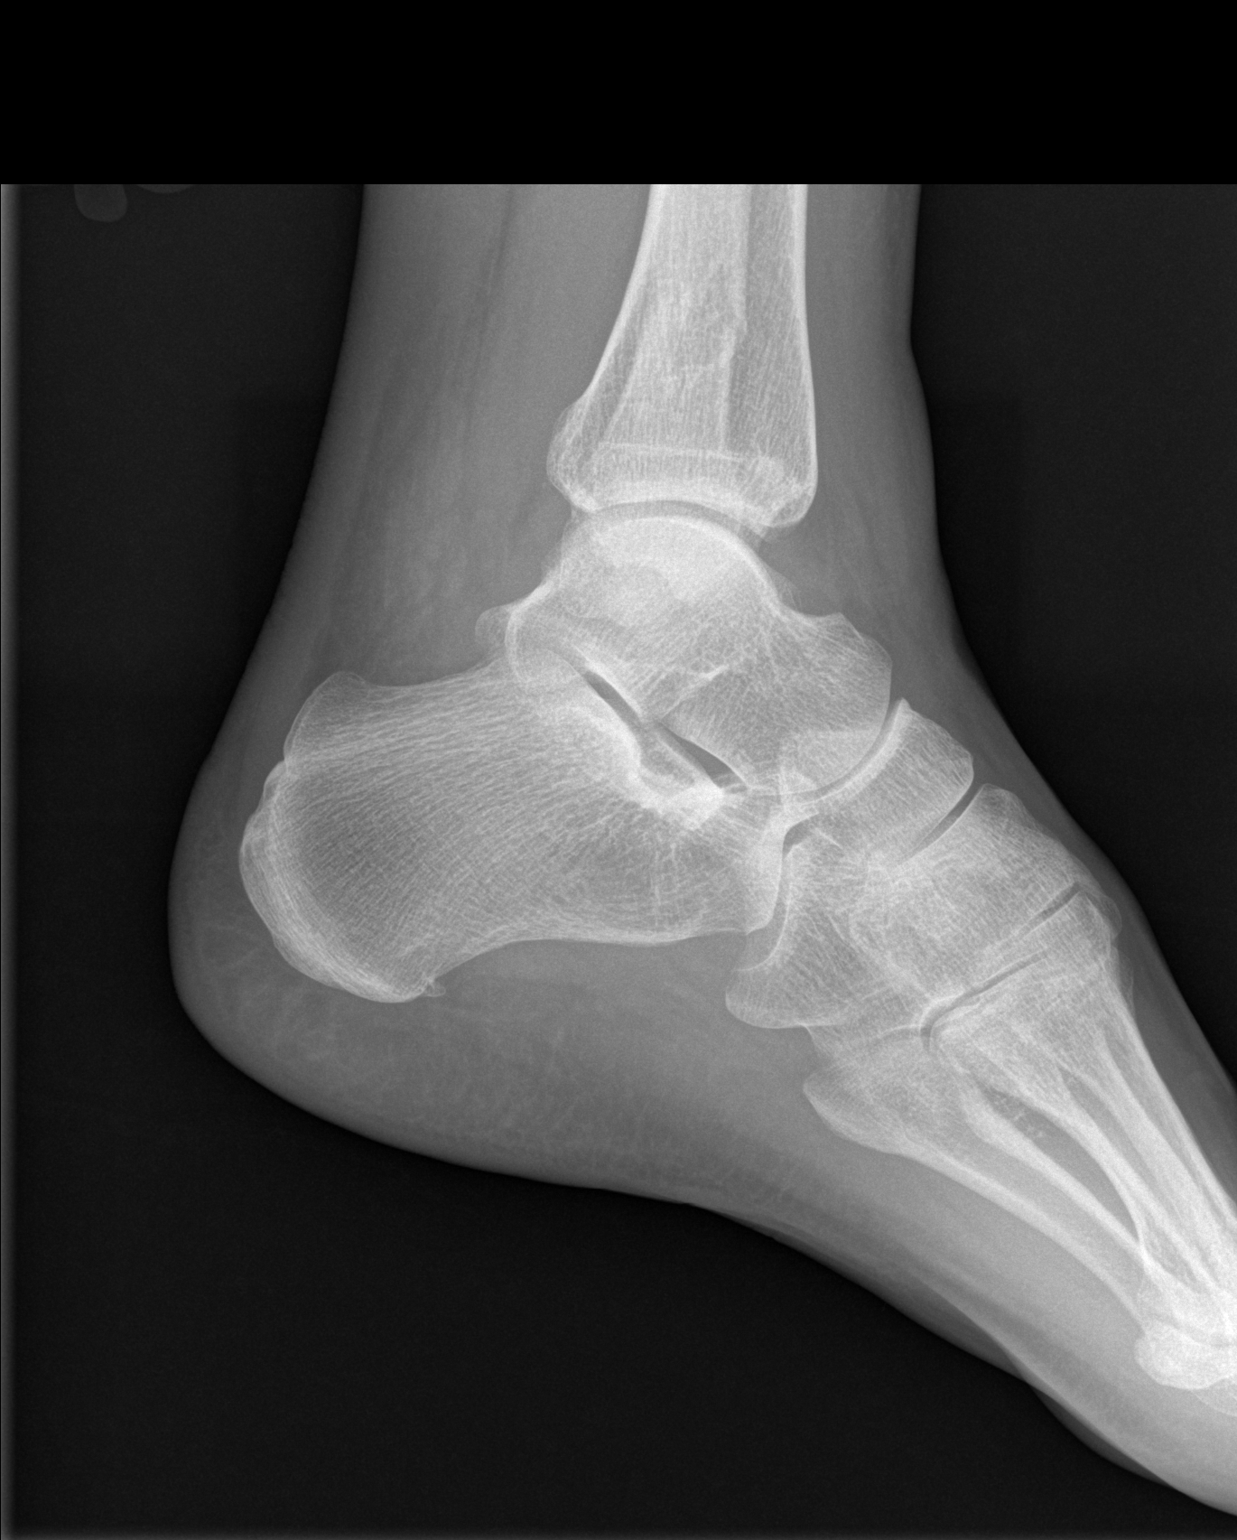

[3 of 3 positions shown; findings below may reference images not displayed]

FINDINGS: The ankle mortise is maintained.  There is an oblique
coursing nondisplaced fracture involving the distal fibula above
the level of the ankle mortise.  Small rounded density near the
distal tip of the lateral malleolus is likely an old avulsion
injury or unfused secondary ossification center.  There is
associated lateral soft tissue swelling.  Visualized mid and hind
foot bony structures appear normal.  A tiny calcaneal heel spurs
noted.
IMPRESSION: Nondisplaced oblique coursing distal fibular fracture above the
level of the ankle mortise (Adriao type C).

## 2014-05-23 ENCOUNTER — Encounter: Payer: Self-pay | Admitting: Emergency Medicine

## 2014-05-23 ENCOUNTER — Emergency Department (INDEPENDENT_AMBULATORY_CARE_PROVIDER_SITE_OTHER): Payer: PRIVATE HEALTH INSURANCE

## 2014-05-23 ENCOUNTER — Emergency Department
Admission: EM | Admit: 2014-05-23 | Discharge: 2014-05-23 | Disposition: A | Discharge status: Home / Self Care | Payer: PRIVATE HEALTH INSURANCE | Source: Home / Self Care | Attending: Emergency Medicine | Admitting: Emergency Medicine

## 2014-05-23 DIAGNOSIS — S20219A Contusion of unspecified front wall of thorax, initial encounter: Secondary | ICD-10-CM

## 2014-05-23 DIAGNOSIS — S20212A Contusion of left front wall of thorax, initial encounter: Secondary | ICD-10-CM

## 2014-05-23 DIAGNOSIS — R079 Chest pain, unspecified: Secondary | ICD-10-CM

## 2014-05-23 HISTORY — DX: Anxiety disorder, unspecified: F41.9

## 2014-05-23 MED ORDER — IBUPROFEN 200 MG PO TABS
ORAL_TABLET | ORAL | Status: DC
Start: 2014-05-23 — End: 2014-08-16

## 2014-05-23 NOTE — ED Provider Notes (Signed)
CSN: [id number]     Arrival date & time 05/23/14  0907 History   First MD Initiated Contact with Patient 05/23/14 [phone]     Chief Complaint  Patient presents with  . Chest Pain    L rib pain   HPI Fell while water skiing 5/24 landing on L side. States "it knocked the breath out of me" .The sharp pain in left anterolateral rib area has gotten gradually worse , exacerbated with deep breaths or coughing. Increased discomfort with movement. No rash or bruising noted. Advil helps some with discomfort. At rest, pain is 2/10. With deep breath, 9/10. If she sneezes, pain is 10 out of 10. Associated symptoms: Denies anterior chest pain or shortness of breath. No lightheadedness or focal neurologic symptoms. Denies abdominal pain or GI symptoms. Denies chance of pregnancy.  Past Medical History  Diagnosis Date  . Anxiety    Past Surgical History  Procedure Laterality Date  . Appendectomy    . Breast enhancement surgery     Family History  Problem Relation Age of Onset  . Heart attack Father   . Sudden death Father   . Heart failure Father   . Heart attack Maternal Grandmother   . Heart attack Maternal Grandfather   . Heart attack Paternal Grandmother   . Diabetes Neg Hx   . Hyperlipidemia Neg Hx    History  Substance Use Topics  . Smoking status: Never Smoker   . Smokeless tobacco: Never Used  . Alcohol Use: Yes     Comment: Social   OB History   Grav Para Term Preterm Abortions TAB SAB Ect Mult Living                 Review of Systems  All other systems reviewed and are negative.   Allergies  Codeine  Home Medications   Prior to Admission medications   Medication Sig Start Date End Date Taking? Authorizing Provider  ALPRAZolam (XANAX) 0.25 MG tablet Take 0.25 mg by mouth at bedtime as needed.      Historical Provider, MD  NUVARING 0.12-0.015 MG/24HR vaginal ring  12/12/11   Historical Provider, MD  sertraline (ZOLOFT) 50 MG tablet Take 50 mg by mouth daily.       Historical Provider, MD   BP 111/70  Pulse 61  Temp(Src) 99 F (37.2 C) (Oral)  Ht 5\' 7"  (1.702 m)  Wt 138 lb 4 oz (62.71 kg)  BMI 21.65 kg/m2  SpO2 100%  LMP 04/10/2014 Physical Exam  Nursing note and vitals reviewed. Constitutional: She is oriented to person, place, and time. She appears well-developed and well-nourished. No distress.  Uncomfortable from left anterior lateral rib pain, but no acute cardiorespiratory distress. Pulse ox 100% on room  HENT:  Head: Normocephalic and atraumatic.  Eyes: Conjunctivae and EOM are normal. Pupils are equal, round, and reactive to light. No scleral icterus.  Neck: Normal range of motion.  Cardiovascular: Normal rate.   Pulmonary/Chest: Effort normal and breath sounds normal. She has no decreased breath sounds.    No chest deformity. No bruising or skin lesions. There is exquisite tenderness to palpation left anterior lateral ribs. Pain exacerbated when taking deep breath.  Abdominal: Soft. She exhibits no distension. There is no tenderness.  Musculoskeletal: Normal range of motion.  Neurological: She is alert and oriented to person, place, and time.  Skin: Skin is warm.  Psychiatric: She has a normal mood and affect.    ED Course  Procedures (including critical care  time) Labs Review Labs Reviewed - No data to display  Imaging Review Dg Ribs Unilateral W/chest Left  05/23/2014   CLINICAL DATA:  Recent traumatic injury with chest pain  EXAM: LEFT RIBS AND CHEST - 3+ VIEW  COMPARISON:  None.  FINDINGS: Cardiac shadow is within normal limits. Increased density is noted over the lower chest bilaterally related to bilateral breast implants. The lungs are clear bilaterally. No acute rib fracture is noted.  IMPRESSION: No acute abnormality noted.   Electronically Signed   By: Alcide CleverMark  Lukens M.D.   On: 05/23/2014 10:09     MDM   1. Contusion of rib on left side    Reviewed x-rays with patient. No acute abnormality. Lungs are clear. No  rib fracture seen.  Treatment options discussed, as well as risks, benefits, alternatives. Patient voiced understanding and agreement with the following plans: OTC ibuprofen 600 mg every 6-8 hours with food as needed for pain, as that has been helping adequately without side effects. She declined any other prescription pain medications or muscle relaxants. She declined any other NSAID choice. Anticipatory guidance discussed.  Gradually increase activity as tolerated. Option of rib belt, but she declined that option at this time.  I would expect the pain to resolve in the next week or so, but if not, followup here or with PCP. Precautions discussed. Red flags discussed. Questions invited and answered. Patient voiced understanding and agreement.     Lajean Manesavid Massey, MD 05/23/14 [phone]

## 2014-05-23 NOTE — ED Notes (Signed)
Fell while water skiing 5/24 landing on L side.  States "it knocked the breath out of me"  Discomfort has gotten gradually worse exp with deep breaths or coughing.  Minimal discomfort with movement.  No bruising noted.  Advil helps some with discomfort.

## 2014-08-16 ENCOUNTER — Encounter: Payer: Self-pay | Admitting: Physician Assistant

## 2014-08-16 ENCOUNTER — Ambulatory Visit (INDEPENDENT_AMBULATORY_CARE_PROVIDER_SITE_OTHER): Payer: PRIVATE HEALTH INSURANCE | Admitting: Physician Assistant

## 2014-08-16 VITALS — BP 102/60 | HR 64 | Ht 66.5 in | Wt 132.0 lb

## 2014-08-16 DIAGNOSIS — Z131 Encounter for screening for diabetes mellitus: Secondary | ICD-10-CM

## 2014-08-16 DIAGNOSIS — F410 Panic disorder [episodic paroxysmal anxiety] without agoraphobia: Secondary | ICD-10-CM

## 2014-08-16 DIAGNOSIS — Z13220 Encounter for screening for lipoid disorders: Secondary | ICD-10-CM

## 2014-08-16 MED ORDER — ALPRAZOLAM 0.5 MG PO TABS: 0.5000 mg | ORAL_TABLET | Freq: Two times a day (BID) | ORAL | Status: DC | PRN

## 2014-08-16 MED ORDER — SERTRALINE HCL 50 MG PO TABS: 25.0000 mg | ORAL_TABLET | Freq: Every day | ORAL | Status: DC

## 2014-08-16 NOTE — Progress Notes (Signed)
   Subjective:    Patient ID: Linda Robertson, female    DOB: January 25, 1971, 43 y.o.   MRN: [id number]  HPI Pt is a 43 yo female who presents to the clinic to establish care.   .. Active Ambulatory Problems    Diagnosis Date Noted  . Right shoulder pain 11/29/2011  . Left ankle injury 12/06/2011  . Panic disorder 08/16/2014   Resolved Ambulatory Problems    Diagnosis Date Noted  . No Resolved Ambulatory Problems   Past Medical History  Diagnosis Date  . Anxiety    .Marland Kitchen. Family History  Problem Relation Age of Onset  . Heart attack Father   . Sudden death Father   . Heart failure Father   . Heart attack Maternal Grandmother   . Heart attack Maternal Grandfather   . Heart attack Paternal Grandmother   . Diabetes Neg Hx   . Hyperlipidemia Neg Hx    .Marland Kitchen. History   Social History  . Marital Status: Divorced    Spouse Name: N/A    Number of Children: N/A  . Years of Education: N/A   Occupational History  . Not on file.   Social History Main Topics  . Smoking status: Never Smoker   . Smokeless tobacco: Never Used  . Alcohol Use: Yes     Comment: Social  . Drug Use: No  . Sexual Activity: Yes   Other Topics Concern  . Not on file   Social History Narrative  . No narrative on file   Pt needs refills today on zoloft and xanax. Pt has a panic disorder that has been controlled for many years on this regimen. She uses xanax maybe once a week. No problems or concerns today.    Review of Systems  All other systems reviewed and are negative.      Objective:   Physical Exam  Constitutional: She is oriented to person, place, and time. She appears well-developed and well-nourished.  HENT:  Head: Normocephalic and atraumatic.  Eyes: Conjunctivae are normal.  Neck: Normal range of motion. Neck supple. No thyromegaly present.  Cardiovascular: Normal rate and normal heart sounds.   Pulmonary/Chest: Effort normal and breath sounds normal. She has no wheezes.  Neurological:  She is alert and oriented to person, place, and time.  Skin: Skin is dry.  Psychiatric: She has a normal mood and affect. Her behavior is normal.          Assessment & Plan:  Panic disorder- zoloft refilled as well as ativan as needed. Discussed use of ativan prn. Pt aware.   Screening labs given for pt to have drawn. Need follow up with CPE.   Pt is due to have mammogram this year. She will schedule. Pap up to date. Has GYN.   Declined flu shot.

## 2015-08-25 ENCOUNTER — Other Ambulatory Visit: Payer: Self-pay | Admitting: Physician Assistant

## 2015-09-10 ENCOUNTER — Ambulatory Visit (INDEPENDENT_AMBULATORY_CARE_PROVIDER_SITE_OTHER): Payer: PRIVATE HEALTH INSURANCE | Admitting: Physician Assistant

## 2015-09-10 ENCOUNTER — Encounter: Payer: Self-pay | Admitting: Physician Assistant

## 2015-09-10 VITALS — BP 107/62 | HR 62 | Ht 66.5 in | Wt 135.0 lb

## 2015-09-10 DIAGNOSIS — F410 Panic disorder [episodic paroxysmal anxiety] without agoraphobia: Secondary | ICD-10-CM | POA: Diagnosis not present

## 2015-09-10 DIAGNOSIS — Z13220 Encounter for screening for lipoid disorders: Secondary | ICD-10-CM

## 2015-09-10 DIAGNOSIS — Z131 Encounter for screening for diabetes mellitus: Secondary | ICD-10-CM

## 2015-09-10 DIAGNOSIS — Z1239 Encounter for other screening for malignant neoplasm of breast: Secondary | ICD-10-CM | POA: Diagnosis not present

## 2015-09-10 MED ORDER — ALPRAZOLAM 0.5 MG PO TABS: 0.5000 mg | ORAL_TABLET | Freq: Two times a day (BID) | ORAL | Status: DC | PRN

## 2015-09-10 MED ORDER — SERTRALINE HCL 50 MG PO TABS: 25.0000 mg | ORAL_TABLET | Freq: Every day | ORAL | Status: DC

## 2015-09-10 NOTE — Progress Notes (Signed)
   Subjective:    Patient ID: Linda Robertson, female    DOB: 12-24-1971, 44 y.o.   MRN: [id number]  HPI Patient is a 44 year old she presents to the clinic to follow-up on panic disorder and to get medication refills. She is doing excellent today. She is currently on Zoloft daily with occasional Xanax use. She's been well controlled for over a year. She denies any side effects from medication.   Review of Systems  All other systems reviewed and are negative.      Objective:   Physical Exam  Constitutional: She is oriented to person, place, and time. She appears well-developed and well-nourished.  HENT:  Head: Normocephalic and atraumatic.  Cardiovascular: Normal rate, regular rhythm and normal heart sounds.   Pulmonary/Chest: Effort normal and breath sounds normal.  Neurological: She is alert and oriented to person, place, and time.  Skin: Skin is dry.  Psychiatric: She has a normal mood and affect. Her behavior is normal.          Assessment & Plan:  Panic disorder-pH Q-9 was 1 and GAD-7 was 2. Zoloft refilled today as well as Xanax. Follow-up as needed.  Discussed with patient need for complete physical. Patient is 44 and has not had a mammogram. I will order this for her today. She does see GYN that I stressed the importance of a mammogram.  Fasting labs were also given to patient to have drawn.

## 2016-03-02 ENCOUNTER — Encounter (HOSPITAL_COMMUNITY): Payer: Self-pay

## 2016-03-02 ENCOUNTER — Emergency Department (HOSPITAL_COMMUNITY)
Admission: EM | Admit: 2016-03-02 | Discharge: 2016-03-03 | Disposition: A | Discharge status: Home / Self Care | Payer: 59 | Attending: Physician Assistant | Admitting: Physician Assistant

## 2016-03-02 DIAGNOSIS — Z79899 Other long term (current) drug therapy: Secondary | ICD-10-CM | POA: Diagnosis not present

## 2016-03-02 DIAGNOSIS — R079 Chest pain, unspecified: Secondary | ICD-10-CM | POA: Diagnosis present

## 2016-03-02 DIAGNOSIS — F419 Anxiety disorder, unspecified: Secondary | ICD-10-CM | POA: Insufficient documentation

## 2016-03-02 LAB — CBC WITH DIFFERENTIAL/PLATELET
BASOS ABS: 0 10*3/uL (ref 0.0–0.1)
Basophils Relative: 0 %
Eosinophils Absolute: 0.1 10*3/uL (ref 0.0–0.7)
Eosinophils Relative: 1 %
HCT: 37.7 % (ref 36.0–46.0)
HEMOGLOBIN: 13.2 g/dL (ref 12.0–15.0)
LYMPHS ABS: 1.8 10*3/uL (ref 0.7–4.0)
Lymphocytes Relative: 23 %
MCH: 33.4 pg (ref 26.0–34.0)
MCHC: 35 g/dL (ref 30.0–36.0)
MCV: 95.4 fL (ref 78.0–100.0)
Monocytes Absolute: 1.1 10*3/uL — ABNORMAL HIGH (ref 0.1–1.0)
Monocytes Relative: 14 %
NEUTROS ABS: 4.8 10*3/uL (ref 1.7–7.7)
NEUTROS PCT: 62 %
Platelets: 225 10*3/uL (ref 150–400)
RBC: 3.95 MIL/uL (ref 3.87–5.11)
RDW: 12.1 % (ref 11.5–15.5)
WBC: 7.8 10*3/uL (ref 4.0–10.5)

## 2016-03-02 LAB — COMPREHENSIVE METABOLIC PANEL
ALK PHOS: 47 U/L (ref 38–126)
ALT: 18 U/L (ref 14–54)
AST: 19 U/L (ref 15–41)
Albumin: 3.9 g/dL (ref 3.5–5.0)
Anion gap: 11 (ref 5–15)
BUN: 17 mg/dL (ref 6–20)
CALCIUM: 9.6 mg/dL (ref 8.9–10.3)
CHLORIDE: 103 mmol/L (ref 101–111)
CO2: 28 mmol/L (ref 22–32)
CREATININE: 0.87 mg/dL (ref 0.44–1.00)
Glucose, Bld: 96 mg/dL (ref 65–99)
Potassium: 3.7 mmol/L (ref 3.5–5.1)
Sodium: 142 mmol/L (ref 135–145)
Total Bilirubin: 0.6 mg/dL (ref 0.3–1.2)
Total Protein: 6.6 g/dL (ref 6.5–8.1)

## 2016-03-02 LAB — I-STAT TROPONIN, ED: TROPONIN I, POC: 0 ng/mL (ref 0.00–0.08)

## 2016-03-02 NOTE — ED Notes (Signed)
Per EMS: Pt from home. Began experiencing a "racing sensation" in chest. Complaining of ongoing chest heaviness x days. Pt is wearing a Halter monitor. Hx anxiety. Family hx of heart disease.

## 2016-03-02 NOTE — ED Provider Notes (Signed)
CSN: [id number]     Arrival date & time 03/02/16  2117 History   First MD Initiated Contact with Patient 03/02/16 2157     Chief Complaint  Patient presents with  . Chest Pain     (Consider location/radiation/quality/duration/timing/severity/associated sxs/prior Treatment) HPI   Patient is a 45 year old female presenting with chest pain and anxiety. Patient has a 17 year history of anxiety. Patient is on Zoloft and Xanax for it. Patient has felt like her heart has been beating "heavily" for the last 6 weeks. She's had extensive cardiology workup including negative stress test, negative echo, and current Holter monitor use.  Today she felt hot and then all of a sudden cold, anxious, shortness of breath, palpitations. She took a Xanax which resolved her symptoms. She came here for reassurance.  Past Medical History  Diagnosis Date  . Anxiety    Past Surgical History  Procedure Laterality Date  . Appendectomy    . Breast enhancement surgery     Family History  Problem Relation Age of Onset  . Heart attack Father   . Sudden death Father   . Heart failure Father   . Heart attack Maternal Grandmother   . Heart attack Maternal Grandfather   . Heart attack Paternal Grandmother   . Diabetes Neg Hx   . Hyperlipidemia Neg Hx    Social History  Substance Use Topics  . Smoking status: Never Smoker   . Smokeless tobacco: Never Used  . Alcohol Use: Yes     Comment: Social   OB History    No data available     Review of Systems  Constitutional: Negative for activity change.  HENT: Negative for congestion.   Respiratory: Negative for shortness of breath.   Cardiovascular: Positive for palpitations. Negative for chest pain.  Gastrointestinal: Negative for abdominal pain.  Genitourinary: Negative for dysuria.  Musculoskeletal: Negative for back pain.  Neurological: Positive for light-headedness. Negative for dizziness.  Psychiatric/Behavioral: The patient is nervous/anxious.        Allergies  Codeine  Home Medications   Prior to Admission medications   Medication Sig Start Date End Date Taking? Authorizing Provider  ALPRAZolam Prudy Feeler) 0.5 MG tablet Take 1 tablet (0.5 mg total) by mouth 2 (two) times daily as needed for anxiety. 09/10/15  Yes Jade L Breeback, PA-C  sertraline (ZOLOFT) 50 MG tablet Take 0.5 tablets (25 mg total) by mouth daily. Patient taking differently: Take 25 mg by mouth every other day.  09/10/15  Yes Jade L Breeback, PA-C   There were no vitals taken for this visit. Physical Exam  Constitutional: She is oriented to person, place, and time. She appears well-developed and well-nourished.  HENT:  Head: Normocephalic and atraumatic.  Eyes: Conjunctivae are normal. Right eye exhibits no discharge.  Neck: Neck supple.  Cardiovascular: Normal rate, regular rhythm and normal heart sounds.   No murmur heard. Pulmonary/Chest: Effort normal and breath sounds normal. She has no wheezes. She has no rales.  Abdominal: Soft. She exhibits no distension. There is no tenderness.  Musculoskeletal: Normal range of motion. She exhibits no edema.  Neurological: She is oriented to person, place, and time. No cranial nerve deficit.  Skin: Skin is warm and dry. No rash noted. She is not diaphoretic.  Psychiatric: She has a normal mood and affect. Her behavior is normal.  Nursing note and vitals reviewed.   ED Course  Procedures (including critical care time) Labs Review Labs Reviewed  CBC WITH DIFFERENTIAL/PLATELET - Abnormal; Notable for  the following:    Monocytes Absolute 1.1 (*)    All other components within normal limits  COMPREHENSIVE METABOLIC PANEL  I-STAT TROPOININ, ED    Imaging Review No results found. I have personally reviewed and evaluated these images and lab results as part of my medical decision-making.   EKG Interpretation   Date/Time:  Tuesday March 02 2016 21:32:30 EST Ventricular Rate:  68 PR Interval:  149 QRS  Duration: 85 QT Interval:  414 QTC Calculation: 440 R Axis:   61 Text Interpretation:  Sinus rhythm Atrial premature complex ST elev,  probable normal early repol pattern No significant change since last  tracing Confirmed by Kandis MannanMACKUEN, COURTNEY ([id number]) on 03/02/2016 10:38:43 PM      MDM   Final diagnoses:  None    Patient is a very pleasant 45 year old female presenting with what sounds like an anxiety attack. Patient had feeling of hot and then cold and felt shortness breath and anxiety. This resolved with Xanax. Patient feels back to baseline. Patient has had extensive workup by cardiology including negative stress test, echo. We will have her call her cardiologist morning to follow-up on her Holter monitor.  Patient's EKG is nonischemic and has negative labs. This sounds like anxiety does not sound ischemic in nature. Do not suspect any other etiology such as PE. Patient is PERC negative.  Vital signs and physical exam are normal.  Courteney Randall AnLyn Mackuen, MD 03/02/16 2351

## 2016-03-03 LAB — COMPLETE METABOLIC PANEL WITH GFR
ALT: 13 U/L (ref 6–29)
AST: 17 U/L (ref 10–35)
Albumin: 4.3 g/dL (ref 3.6–5.1)
Alkaline Phosphatase: 48 U/L (ref 33–115)
BUN: 18 mg/dL (ref 7–25)
CALCIUM: 9.4 mg/dL (ref 8.6–10.2)
CHLORIDE: 102 mmol/L (ref 98–110)
CO2: 29 mmol/L (ref 20–31)
CREATININE: 0.89 mg/dL (ref 0.50–1.10)
GFR, EST NON AFRICAN AMERICAN: 79 mL/min (ref >=60)
Glucose, Bld: 90 mg/dL (ref 65–99)
POTASSIUM: 4.4 mmol/L (ref 3.5–5.3)
Sodium: 139 mmol/L (ref 135–146)
Total Bilirubin: 0.6 mg/dL (ref 0.2–1.2)
Total Protein: 7.1 g/dL (ref 6.1–8.1)

## 2016-03-03 LAB — LIPID PANEL
CHOL/HDL RATIO: 2.8 ratio (ref <=5.0)
CHOLESTEROL: 188 mg/dL (ref 125–200)
HDL: 66 mg/dL (ref >=46)
LDL CALC: 104 mg/dL (ref <130)
Triglycerides: 89 mg/dL (ref <150)
VLDL: 18 mg/dL (ref <30)

## 2016-03-03 MED ORDER — ALPRAZOLAM 1 MG PO TABS: 1.0000 mg | ORAL_TABLET | Freq: Two times a day (BID) | ORAL | Status: DC

## 2016-03-03 NOTE — Discharge Instructions (Signed)
Please follow-up with your regular physician as well as your cardiologist. Please return with any concerns.  Panic Attacks Panic attacks are sudden, short feelings of great fear or discomfort. You may have them for no reason when you are relaxed, when you are uneasy (anxious), or when you are sleeping.  HOME CARE  Take all your medicines as told.  Check with your doctor before starting new medicines.  Keep all doctor visits. GET HELP IF:  You are not able to take your medicines as told.  Your symptoms do not get better.  Your symptoms get worse. GET HELP RIGHT AWAY IF:  Your attacks seem different than your normal attacks.  You have thoughts about hurting yourself or others.  You take panic attack medicine and you have a side effect. MAKE SURE YOU:  Understand these instructions.  Will watch your condition.  Will get help right away if you are not doing well or get worse.   This information is not intended to replace advice given to you by your health care provider. Make sure you discuss any questions you have with your health care provider.   Document Released: 01/15/2011 Document Revised: 10/03/2013 Document Reviewed: 07/27/2013 Elsevier Interactive Patient Education [employer]2016 Elsevier Inc.

## 2016-03-15 ENCOUNTER — Encounter: Payer: Self-pay | Admitting: Physician Assistant

## 2016-03-15 ENCOUNTER — Ambulatory Visit (INDEPENDENT_AMBULATORY_CARE_PROVIDER_SITE_OTHER): Payer: 59 | Admitting: Physician Assistant

## 2016-03-15 VITALS — BP 113/58 | HR 59 | Ht 66.5 in | Wt 132.0 lb

## 2016-03-15 DIAGNOSIS — G4700 Insomnia, unspecified: Secondary | ICD-10-CM

## 2016-03-15 DIAGNOSIS — R5383 Other fatigue: Secondary | ICD-10-CM | POA: Diagnosis not present

## 2016-03-15 DIAGNOSIS — N951 Menopausal and female climacteric states: Secondary | ICD-10-CM | POA: Diagnosis not present

## 2016-03-15 DIAGNOSIS — F410 Panic disorder [episodic paroxysmal anxiety] without agoraphobia: Secondary | ICD-10-CM

## 2016-03-15 DIAGNOSIS — R232 Flushing: Secondary | ICD-10-CM

## 2016-03-15 DIAGNOSIS — R42 Dizziness and giddiness: Secondary | ICD-10-CM | POA: Diagnosis not present

## 2016-03-15 MED ORDER — ALPRAZOLAM 0.5 MG PO TABS: 0.5000 mg | ORAL_TABLET | Freq: Two times a day (BID) | ORAL | Status: DC | PRN

## 2016-03-15 MED ORDER — TRAZODONE HCL 50 MG PO TABS: 25.0000 mg | ORAL_TABLET | Freq: Every evening | ORAL | Status: DC | PRN

## 2016-03-15 NOTE — Progress Notes (Signed)
   Subjective:    Patient ID: Linda Robertson, female    DOB: 12-01-1971, 45 y.o.   MRN: [id number]  HPI Pt is a 45 yo female who presents to the clinic to follow up on anxiety. Pt started experiences hot flashes, dizziness, trouble sleeping, and no energy for last couple of months. She had a few episodes where her heart felt like it was beating out of her chest. She has a family hx of stroke and MI and so she made cardiology appt. She has had holter monitor and echo stress. Both were normal. She has anxiety and she does feel like worsening. When her heart starts to beat fast sends her into anxiety attack. She went to ED on 02/28/16 and was given xanax #6. It does help with attacks. She is just concerned on what has changed. She continues to exercise regularly. She does admit decreasing on zoloft because she has been controlled for so long she thought she could decrease dose or go off.    Review of Systems  All other systems reviewed and are negative.  See HPI.    Objective:   Physical Exam  Constitutional: She is oriented to person, place, and time. She appears well-developed and well-nourished.  HENT:  Head: Normocephalic and atraumatic.  Cardiovascular: Normal rate, regular rhythm and normal heart sounds.   Pulmonary/Chest: Effort normal and breath sounds normal.  Neurological: She is alert and oriented to person, place, and time.  Psychiatric: She has a normal mood and affect. Her behavior is normal.          Assessment & Plan:  Panic Disorder- GAD-7 was 16. PHQ-9 was 8. Some of increased anxiety could be coming from decrease in zoloft. Go back up to 50mg  daily. Xanax only as needed for attacks. Discussed abuse potential. I do think some of her symptoms could be linked to hot flashes and menopause. Follow up in 2 months.   Hot flashes- will confirm menopause with [facility]. No period for 6 months. zoloft could help. HO with herbal regimen. Discuss HRT. Risk and benefits discussed.  acupuncture discussed.   Insomnia- likely linked to anxiety and menopause. Try unisom. Trazodone also another option. Discussed side effects. Follow up in 2 months.   Dizziness- likely linked to a few symptoms. Will continue to monitor and see if improves. BP low stay hydrated.   No energy- could be due to insomnia. Fatigue panel ordered.

## 2016-03-15 NOTE — Patient Instructions (Addendum)
unisom at bedtime for sleep or consider melatonin up to  at bedtime.   Menopause and Herbal Products WHAT IS MENOPAUSE? Menopause is the normal time of life when menstrual periods decrease in frequency and eventually stop completely. This process can take several years for some women. Menopause is complete when you have had an absence of menstruation for a full year since your last menstrual period. It usually occurs between the ages of 90 and 66. It is not common for menopause to begin before the age of 51. During menopause, your body stops producing the female hormones estrogen and progesterone. Common symptoms associated with this loss of hormones (vasomotor symptoms) are:  Hot flashes.  Hot flushes.  Night sweats. Other common symptoms and complications of menopause include:  Decrease in sex drive.  Vaginal dryness and thinning of the walls of the vagina. This can make sex painful.  Dryness of the skin and development of wrinkles.  Headaches.  Tiredness.  Irritability.  Memory problems.  Weight gain.  Bladder infections.  Hair growth on the face and chest.  Inability to reproduce offspring (infertility).  Loss of density in the bones (osteoporosis) increasing your risk for breaks (fractures).  Depression.  Hardening and narrowing of the arteries (atherosclerosis). This increases your risk of heart attack and stroke. WHAT TREATMENT OPTIONS ARE AVAILABLE? There are many treatment choices for menopause symptoms. The most common treatment is hormone replacement therapy. Many alternative therapies for menopause are emerging, including the use of herbal products. These supplements can be found in the form of herbs, teas, oils, tinctures, and pills. Common herbal supplements for menopause are made from plants that contain phytoestrogens. Phytoestrogens are compounds that occur naturally in plants and plant products. They act like estrogen in the body. Foods and herbs that  contain phytoestrogens include:  Soy.  Flax seeds.  Red clover.  Ginseng. WHAT MENOPAUSE SYMPTOMS MAY BE HELPED IF I USE HERBAL PRODUCTS?  Vasomotor symptoms. These may be helped by:  Soy. Some studies show that soy may have a moderate benefit for hot flashes.  Black cohosh. There is limited evidence indicating this may be beneficial for hot flashes.  Symptoms that are related to heart and blood vessel disease. These may be helped by soy. Studies have shown that soy can help to lower cholesterol.  Depression. This may be helped by:  St. John's wort. There is limited evidence that shows this may help mild to moderate depression.  Black cohosh. There is evidence that this may help depression and mood swings.  Osteoporosis. Soy may help to decrease bone loss that is associated with menopause and may prevent osteoporosis. Limited evidence indicates that red clover may offer some bone loss protection as well. Other herbal products that are commonly used during menopause lack enough evidence to support their use as a replacement for conventional menopause therapies. These products include evening primrose, ginseng, and red clover. WHAT ARE THE CASES WHEN HERBAL PRODUCTS SHOULD NOT BE USED DURING MENOPAUSE? Do not use herbal products during menopause without your health care provider's approval if:  You are taking medicine.  You have a preexisting liver condition. ARE THERE ANY RISKS IN MY TAKING HERBAL PRODUCTS DURING MENOPAUSE? If you choose to use herbal products to help with symptoms of menopause, keep in mind that:  Different supplements have different and unmeasured amounts of herbal ingredients.  Herbal products are not regulated the same way that medicines are.  Concentrations of herbs may vary depending on the way they are  prepared. For example, the concentration may be different in a pill, tea, oil, and tincture.  Little is known about the risks of using herbal products,  particularly the risks of long-term use.  Some herbal supplements can be harmful when combined with certain medicines. Most commonly reported side effects of herbal products are mild. However, if used improperly, many herbal supplements can cause serious problems. Talk to your health care provider before starting any herbal product. If problems develop, stop taking the supplement and let your health care provider know.   This information is not intended to replace advice given to you by your health care provider. Make sure you discuss any questions you have with your health care provider.   Document Released: 05/31/2008 Document Revised: 01/03/2015 Document Reviewed: 05/28/2014 Elsevier Interactive Patient Education [employer]2016 Elsevier Inc.

## 2016-03-16 ENCOUNTER — Encounter: Payer: Self-pay | Admitting: Physician Assistant

## 2016-03-16 DIAGNOSIS — Z780 Asymptomatic menopausal state: Secondary | ICD-10-CM | POA: Insufficient documentation

## 2016-03-16 DIAGNOSIS — R5383 Other fatigue: Secondary | ICD-10-CM | POA: Insufficient documentation

## 2016-03-16 DIAGNOSIS — E559 Vitamin D deficiency, unspecified: Secondary | ICD-10-CM | POA: Insufficient documentation

## 2016-03-16 DIAGNOSIS — R42 Dizziness and giddiness: Secondary | ICD-10-CM | POA: Insufficient documentation

## 2016-03-16 DIAGNOSIS — R232 Flushing: Secondary | ICD-10-CM | POA: Insufficient documentation

## 2016-03-16 DIAGNOSIS — G4700 Insomnia, unspecified: Secondary | ICD-10-CM | POA: Insufficient documentation

## 2016-03-16 LAB — FERRITIN: FERRITIN: 105 ng/mL (ref 10–232)

## 2016-03-16 LAB — FOLLICLE STIMULATING HORMONE: FSH: 58.1 m[IU]/mL

## 2016-03-16 LAB — CBC
HEMATOCRIT: 41.4 % (ref 36.0–46.0)
HEMOGLOBIN: 14.4 g/dL (ref 12.0–15.0)
MCH: 33.3 pg (ref 26.0–34.0)
MCHC: 34.8 g/dL (ref 30.0–36.0)
MCV: 95.6 fL (ref 78.0–100.0)
MPV: 9.4 fL (ref 8.6–12.4)
PLATELETS: 284 10*3/uL (ref 150–400)
RBC: 4.33 MIL/uL (ref 3.87–5.11)
RDW: 12.5 % (ref 11.5–15.5)
WBC: 7.9 10*3/uL (ref 4.0–10.5)

## 2016-03-16 LAB — C-REACTIVE PROTEIN: CRP: <0.5 mg/dL

## 2016-03-16 LAB — VITAMIN D 25 HYDROXY (VIT D DEFICIENCY, FRACTURES): Vit D, 25-Hydroxy: 22 ng/mL — ABNORMAL LOW (ref 30–100)

## 2016-03-16 LAB — SEDIMENTATION RATE: SED RATE: 9 mm/h (ref 0–20)

## 2016-03-16 LAB — TSH: TSH: 1.66 m[IU]/L

## 2016-03-16 LAB — VITAMIN B12: Vitamin B-12: 673 pg/mL (ref 200–1100)

## 2016-06-01 ENCOUNTER — Ambulatory Visit (INDEPENDENT_AMBULATORY_CARE_PROVIDER_SITE_OTHER): Payer: 59

## 2016-06-01 ENCOUNTER — Encounter: Payer: Self-pay | Admitting: Family Medicine

## 2016-06-01 ENCOUNTER — Ambulatory Visit (INDEPENDENT_AMBULATORY_CARE_PROVIDER_SITE_OTHER): Payer: 59 | Admitting: Family Medicine

## 2016-06-01 VITALS — BP 107/73 | HR 51 | Wt 140.0 lb

## 2016-06-01 DIAGNOSIS — M7732 Calcaneal spur, left foot: Secondary | ICD-10-CM | POA: Diagnosis not present

## 2016-06-01 DIAGNOSIS — M79672 Pain in left foot: Secondary | ICD-10-CM

## 2016-06-01 DIAGNOSIS — M79671 Pain in right foot: Secondary | ICD-10-CM

## 2016-06-01 DIAGNOSIS — M7731 Calcaneal spur, right foot: Secondary | ICD-10-CM | POA: Diagnosis not present

## 2016-06-01 NOTE — Patient Instructions (Signed)
Thank you for coming in today. Ice your heels if able.  Do the stretches as much as possible.  Use crutches as needed.  Return in 2 weeks.

## 2016-06-01 NOTE — Progress Notes (Signed)
   Subjective:    I'm seeing this patient as a consultation for:  Tandy GawJade Breeback PA  CC: Heel pain  HPI: About 8 days ago patient was seen on some rings above a concrete floor. She fell barefoot onto the floor landing on her left heel. She developed immediate pain and the plantar left calcaneus. She's been limping for the last several days and notes that her right heel has also become quite painful in the same location. She notes she is able to limp around on her tiptoes with heel cushions. She additionally has used some ice and NSAIDs which have helped enough to allow her to walk for work. 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 Her job requires lots of walking. She is able to work but notes significant pain. Her pain is interfering with her ability to exercise. Both heels hurt equally.  Past medical history, Surgical history, Family history not pertinant except as noted below, Social history, Allergies, and medications have been entered into the medical record, reviewed, and no changes needed.   Review of Systems: No headache, visual changes, nausea, vomiting, diarrhea, constipation, dizziness, abdominal pain, skin rash, fevers, chills, night sweats, weight loss, swollen lymph nodes, body aches, joint swelling, muscle aches, chest pain, shortness of breath, mood changes, visual or auditory hallucinations.   Objective:    Filed Vitals:   06/01/16 0907  BP: 107/73  Pulse: 51   General: Well Developed, well nourished, and in no acute distress.  Neuro/Psych: Alert and oriented x3, extra-ocular muscles intact, able to move all 4 extremities, sensation grossly intact. Skin: Warm and dry, no rashes noted.  Respiratory: Not using accessory muscles, speaking in full sentences, trachea midline.  Cardiovascular: Pulses palpable, no extremity edema. Abdomen: Does not appear distended. MSK: Feet are normal-appearing with no ecchymosis or palpable step-offs along the bony surfaces or  crepitations. The plantar calcaneus bilaterally are not particularly tender even to deep palpation. However calcaneal squeeze test is positive bilaterally. Pulses capillary refill and sensation are intact distally to the feet. Ankles are normal appearing with normal stability and motion.   No results found for this or any previous visit (from the past 24 hour(s)). Dg Os Calcis Left  06/01/2016  CLINICAL DATA:  Bilateral heel pain after falling from exercising rings on concrete 8 days ago EXAM: LEFT OS CALCIS - 2+ VIEW COMPARISON:  01/17/2012 FINDINGS: Two views of left calcaneus submitted. No acute fracture or subluxation. Tiny plantar spur of calcaneus. IMPRESSION: No acute fracture or subluxation.  Tiny plantar spur of calcaneus. Electronically Signed   By: Natasha MeadLiviu  Pop M.D.   On: 06/01/2016 09:43   Dg Os Calcis Right  06/01/2016  CLINICAL DATA:  Bilateral heel pain after falling from exercising rings on concrete 8 days ago EXAM: RIGHT OS CALCIS - 2+ VIEW COMPARISON:  None. FINDINGS: Two views of the right calcaneus submitted. No acute fracture or subluxation. Tiny plantar spur of calcaneus. IMPRESSION: No acute fracture or subluxation.  Tiny plantar spur of calcaneus. Electronically Signed   By: Natasha MeadLiviu  Pop M.D.   On: 06/01/2016 09:41    Impression and Recommendations:   45 year old woman with Calcaneus contusion bilaterally. This is functionally acting up that like plantar fasciitis. Plan for gel heel cushions ice therapy NSAIDs and eccentric calf exercises. Use crutches as needed for pain. Recheck in 2 weeks if not better.  This case required medical decision making of moderate complexity.

## 2016-08-23 ENCOUNTER — Other Ambulatory Visit: Payer: Self-pay | Admitting: *Deleted

## 2016-08-23 MED ORDER — ALPRAZOLAM 0.5 MG PO TABS: 0.5000 mg | ORAL_TABLET | Freq: Two times a day (BID) | ORAL | 0 refills | Status: DC | PRN

## 2016-10-20 ENCOUNTER — Other Ambulatory Visit: Payer: Self-pay | Admitting: *Deleted

## 2016-10-20 MED ORDER — ALPRAZOLAM 0.5 MG PO TABS: 0.5000 mg | ORAL_TABLET | Freq: Two times a day (BID) | ORAL | 0 refills | Status: DC | PRN

## 2016-11-15 ENCOUNTER — Other Ambulatory Visit: Payer: Self-pay | Admitting: *Deleted

## 2016-11-15 MED ORDER — ALPRAZOLAM 0.5 MG PO TABS: 0.5000 mg | ORAL_TABLET | Freq: Two times a day (BID) | ORAL | 0 refills | Status: DC | PRN

## 2016-11-29 ENCOUNTER — Encounter: Payer: Self-pay | Admitting: Physician Assistant

## 2016-11-29 ENCOUNTER — Ambulatory Visit (INDEPENDENT_AMBULATORY_CARE_PROVIDER_SITE_OTHER): Payer: 59 | Admitting: Physician Assistant

## 2016-11-29 VITALS — BP 133/56 | HR 67 | Wt 141.0 lb

## 2016-11-29 DIAGNOSIS — F410 Panic disorder [episodic paroxysmal anxiety] without agoraphobia: Secondary | ICD-10-CM

## 2016-11-29 DIAGNOSIS — R232 Flushing: Secondary | ICD-10-CM

## 2016-11-29 DIAGNOSIS — F5101 Primary insomnia: Secondary | ICD-10-CM

## 2016-11-29 MED ORDER — ALPRAZOLAM 0.5 MG PO TABS: 0.5000 mg | ORAL_TABLET | Freq: Two times a day (BID) | ORAL | 3 refills | Status: DC | PRN

## 2016-11-29 MED ORDER — SERTRALINE HCL 50 MG PO TABS: 50.0000 mg | ORAL_TABLET | Freq: Every day | ORAL | 1 refills | Status: DC

## 2016-11-29 NOTE — Patient Instructions (Signed)
Menopause and Herbal Products Introduction WHAT IS MENOPAUSE? Menopause is the normal time of life when menstrual periods decrease in frequency and eventually stop completely. This process can take several years for some women. Menopause is complete when you have had an absence of menstruation for a full year since your last menstrual period. It usually occurs between the ages of 48 and 55. It is not common for menopause to begin before the age of 40. During menopause, your body stops producing the female hormones estrogen and progesterone. Common symptoms associated with this loss of hormones (vasomotor symptoms) are:  Hot flashes.  Hot flushes.  Night sweats. Other common symptoms and complications of menopause include:  Decrease in sex drive.  Vaginal dryness and thinning of the walls of the vagina. This can make sex painful.  Dryness of the skin and development of wrinkles.  Headaches.  Tiredness.  Irritability.  Memory problems.  Weight gain.  Bladder infections.  Hair growth on the face and chest.  Inability to reproduce offspring (infertility).  Loss of density in the bones (osteoporosis) increasing your risk for breaks (fractures).  Depression.  Hardening and narrowing of the arteries (atherosclerosis). This increases your risk of heart attack and stroke. WHAT TREATMENT OPTIONS ARE AVAILABLE? There are many treatment choices for menopause symptoms. The most common treatment is hormone replacement therapy. Many alternative therapies for menopause are emerging, including the use of herbal products. These supplements can be found in the form of herbs, teas, oils, tinctures, and pills. Common herbal supplements for menopause are made from plants that contain phytoestrogens. Phytoestrogens are compounds that occur naturally in plants and plant products. They act like estrogen in the body. Foods and herbs that contain phytoestrogens include:  Soy.  Flax seeds.  Red  clover.  Ginseng. WHAT MENOPAUSE SYMPTOMS MAY BE HELPED IF I USE HERBAL PRODUCTS?  Vasomotor symptoms. These may be helped by:  Soy. Some studies show that soy may have a moderate benefit for hot flashes.  Black cohosh. There is limited evidence indicating this may be beneficial for hot flashes.  Symptoms that are related to heart and blood vessel disease. These may be helped by soy. Studies have shown that soy can help to lower cholesterol.  Depression. This may be helped by:  St. John's wort. There is limited evidence that shows this may help mild to moderate depression.  Black cohosh. There is evidence that this may help depression and mood swings.  Osteoporosis. Soy may help to decrease bone loss that is associated with menopause and may prevent osteoporosis. Limited evidence indicates that red clover may offer some bone loss protection as well. Other herbal products that are commonly used during menopause lack enough evidence to support their use as a replacement for conventional menopause therapies. These products include evening primrose, ginseng, and red clover. WHAT ARE THE CASES WHEN HERBAL PRODUCTS SHOULD NOT BE USED DURING MENOPAUSE? Do not use herbal products during menopause without your health care provider's approval if:  You are taking medicine.  You have a preexisting liver condition. ARE THERE ANY RISKS IN MY TAKING HERBAL PRODUCTS DURING MENOPAUSE? If you choose to use herbal products to help with symptoms of menopause, keep in mind that:  Different supplements have different and unmeasured amounts of herbal ingredients.  Herbal products are not regulated the same way that medicines are.  Concentrations of herbs may vary depending on the way they are prepared. For example, the concentration may be different in a pill, tea, oil, and   tincture.  Little is known about the risks of using herbal products, particularly the risks of long-term use.  Some herbal  supplements can be harmful when combined with certain medicines. Most commonly reported side effects of herbal products are mild. However, if used improperly, many herbal supplements can cause serious problems. Talk to your health care provider before starting any herbal product. If problems develop, stop taking the supplement and let your health care provider know. This information is not intended to replace advice given to you by your health care provider. Make sure you discuss any questions you have with your health care provider. Document Released: 05/31/2008 Document Revised: 05/20/2016 Document Reviewed: 05/28/2014  2017 Elsevier  

## 2016-11-29 NOTE — Progress Notes (Addendum)
   Subjective:    Patient ID: Linda Robertson, female    DOB: [birth year], 45 y.o.   MRN: [id number]  HPI Pt is a 45 yo female who presents to the clinic for 6 month follow up on panic and insomnia.  She continues to take zoloft daily and xanax as needed. She does feel like xanax usage is becoming more frequent. On average she is taking it every other day. She admits that her hot flashes have worsened some as well over past few months. She does not know if that is making her anxiety worse. She denies any suicidal or homicidal thoughts. She is also not exercising like she should. She is having trouble sleeping but has not tried trazodone given at last visit. zquil does help some.    Review of Systems  All other systems reviewed and are negative.      Objective:   Physical Exam  Constitutional: She is oriented to person, place, and time. She appears well-developed and well-nourished.  HENT:  Head: Normocephalic and atraumatic.  Cardiovascular: Normal rate, regular rhythm and normal heart sounds.   Pulmonary/Chest: Effort normal and breath sounds normal.  Neurological: She is alert and oriented to person, place, and time.  Psychiatric: She has a normal mood and affect. Her behavior is normal.          Assessment & Plan:  Marland Kitchen.Marland Kitchen.Linda Robertson was seen today for f/u medications.  Diagnoses and all orders for this visit:  Panic disorder -     ALPRAZolam (XANAX) 0.5 MG tablet; Take 1 tablet (0.5 mg total) by mouth 2 (two) times daily as needed for anxiety. -     sertraline (ZOLOFT) 50 MG tablet; Take 1 tablet (50 mg total) by mouth daily.  Primary insomnia  Hot flashes  PHQ-9 was 3. GAD was 5.  Refilled xanax and zoloft for 6 months. Discussed as needed usage of anxiety. Pt declined adding buspar or increasing zoloft.  Will try to get patient sleeping and see if helps with anxiety. Encouraged to try trazodone or take unisom/zquil nightly.  For hot flashes consider herbal. HO given.

## 2017-03-28 ENCOUNTER — Other Ambulatory Visit: Payer: Self-pay | Admitting: *Deleted

## 2017-03-28 DIAGNOSIS — F410 Panic disorder [episodic paroxysmal anxiety] without agoraphobia: Secondary | ICD-10-CM

## 2017-03-28 MED ORDER — ALPRAZOLAM 0.5 MG PO TABS: 0.5000 mg | ORAL_TABLET | Freq: Two times a day (BID) | ORAL | 2 refills | Status: DC | PRN

## 2017-05-10 ENCOUNTER — Emergency Department
Admission: EM | Admit: 2017-05-10 | Discharge: 2017-05-10 | Disposition: A | Discharge status: Home / Self Care | Payer: 59 | Source: Home / Self Care | Attending: Family Medicine | Admitting: Family Medicine

## 2017-05-10 ENCOUNTER — Encounter: Payer: Self-pay | Admitting: *Deleted

## 2017-05-10 DIAGNOSIS — L089 Local infection of the skin and subcutaneous tissue, unspecified: Secondary | ICD-10-CM | POA: Diagnosis not present

## 2017-05-10 DIAGNOSIS — S60511A Abrasion of right hand, initial encounter: Secondary | ICD-10-CM | POA: Diagnosis not present

## 2017-05-10 DIAGNOSIS — W5503XA Scratched by cat, initial encounter: Secondary | ICD-10-CM | POA: Diagnosis not present

## 2017-05-10 MED ORDER — AMOXICILLIN-POT CLAVULANATE 875-125 MG PO TABS: 1.0000 | ORAL_TABLET | Freq: Two times a day (BID) | ORAL | 0 refills | Status: DC

## 2017-05-10 NOTE — ED Provider Notes (Signed)
CSN: [id number]     Arrival date & time 05/10/17  1118 History   First MD Initiated Contact with Patient 05/10/17 1139     Chief Complaint  Patient presents with  . Puncture Wound  . Hand Pain   (Consider location/radiation/quality/duration/timing/severity/associated sxs/prior Treatment) HPI  Linda Robertson is a 46 y.o. female presenting to UC with c/o gradually worsening redness and stiffness to her Right index finger that started 3 days ago after being scratched a her neighbor's cat she was watching.  She has tried ice but no relief.  She is UTD on tetanus and cat is UTD on its vaccines.  Pt notified owner of cat about the incident.  She is Right hand dominant. Denies fever or chills. Pain is minimal.   Past Medical History:  Diagnosis Date  . Anxiety    Past Surgical History:  Procedure Laterality Date  . APPENDECTOMY    . BREAST ENHANCEMENT SURGERY     Family History  Problem Relation Age of Onset  . Heart attack Father   . Sudden death Father   . Heart failure Father   . Heart attack Maternal Grandmother   . Congestive Heart Failure Maternal Grandmother   . Atrial fibrillation Mother   . Heart attack Maternal Grandfather   . Heart attack Paternal Grandmother   . Diabetes Neg Hx   . Hyperlipidemia Neg Hx    Social History  Substance Use Topics  . Smoking status: Never Smoker  . Smokeless tobacco: Never Used  . Alcohol use Yes     Comment: Social   OB History    No data available     Review of Systems  Constitutional: Negative for chills and fever.  Musculoskeletal: Positive for arthralgias, joint swelling and myalgias.       Right index finger  Skin: Positive for color change and wound. Negative for rash.    Allergies  Codeine  Home Medications   Prior to Admission medications   Medication Sig Start Date End Date Taking? Authorizing Provider  ALPRAZolam Prudy Feeler) 0.5 MG tablet Take 1 tablet (0.5 mg total) by mouth 2 (two) times daily as needed for  anxiety. 03/28/17   Breeback, Jade L, PA-C  amoxicillin-clavulanate (AUGMENTIN) 875-125 MG tablet Take 1 tablet by mouth 2 (two) times daily. One po bid x 7 days 05/10/17   Junius Finner, PA-C  sertraline (ZOLOFT) 50 MG tablet Take 1 tablet (50 mg total) by mouth daily. 11/29/16   Breeback, Lonna Cobb, PA-C  traZODone (DESYREL) 50 MG tablet Take 0.5-1 tablets (25-50 mg total) by mouth at bedtime as needed for sleep. 03/15/16   Jomarie Longs, PA-C   Meds Ordered and Administered this Visit  Medications - No data to display  BP 137/88 (BP Location: Left Arm)   Pulse 68   Temp 98.8 F (37.1 C) (Oral)   Wt 145 lb (65.8 kg)   LMP 10/10/2016   SpO2 98%   BMI 23.05 kg/m  No data found.   Physical Exam  Constitutional: She is oriented to person, place, and time. She appears well-developed and well-nourished.  HENT:  Head: Normocephalic and atraumatic.  Eyes: EOM are normal.  Neck: Normal range of motion.  Cardiovascular: Normal rate.   Pulmonary/Chest: Effort normal.  Musculoskeletal: She exhibits edema and tenderness.  Right index finger: mild edema, minimal tenderness. Slight decreased flexion due to swelling.   Neurological: She is alert and oriented to person, place, and time.  Skin: Skin is warm and dry.  Right index finger: faint erythema, mild edema. Superficial pinpoint abrasions on dorsal and volar aspect of finger. No bleeding or drainage. No red streaking.   Psychiatric: She has a normal mood and affect. Her behavior is normal.  Nursing note and vitals reviewed.   Urgent Care Course     Procedures (including critical care time)  Labs Review Labs Reviewed - No data to display  Imaging Review No results found.    MDM   1. Infected cat scratch of hand, right, initial encounter     Hx and exam c/w infected cat scratch to Right index finger.  Rx: Augmentin  Home care instructions provided. F/u with PCP or return to UC in 2-3 days if not improving, sooner if  worsening.    Junius FinnerO'Malley, Arwin Bisceglia, PA-C 05/10/17 1321

## 2017-05-10 NOTE — ED Triage Notes (Signed)
Patient reports she received several puncture wounds from a cat of a friend that she was pet sitting 3 days ago. Th only site causing problems is her right index finger. Swelling, discoloration, and pain down in her hand is developing. Cat is UTD on vaccines and vet and owner notified.

## 2017-05-12 ENCOUNTER — Emergency Department (INDEPENDENT_AMBULATORY_CARE_PROVIDER_SITE_OTHER)
Admission: EM | Admit: 2017-05-12 | Discharge: 2017-05-12 | Disposition: A | Discharge status: Home / Self Care | Payer: 59 | Source: Home / Self Care | Attending: Family Medicine | Admitting: Family Medicine

## 2017-05-12 ENCOUNTER — Encounter: Payer: Self-pay | Admitting: Emergency Medicine

## 2017-05-12 ENCOUNTER — Telehealth: Payer: Self-pay | Admitting: Emergency Medicine

## 2017-05-12 DIAGNOSIS — L03011 Cellulitis of right finger: Secondary | ICD-10-CM | POA: Diagnosis not present

## 2017-05-12 MED ORDER — DOXYCYCLINE HYCLATE 100 MG PO CAPS: 100.0000 mg | ORAL_CAPSULE | Freq: Two times a day (BID) | ORAL | 0 refills | Status: DC

## 2017-05-12 NOTE — Discharge Instructions (Signed)
Stop Augementin and begin doxycycline.  Elevate hand.  Begin warm soaks 3 or 4 times daily for about 20 minutes.  Recommend taking a probiotic such as yogurt with live culture.  May continue ibuprofen.  If symptoms become significantly worse during the night or over the weekend, proceed to the local emergency room.

## 2017-05-12 NOTE — ED Provider Notes (Signed)
[facility]    CSN: [id number] Arrival date & time: 05/12/17  1120     History   Chief Complaint Chief Complaint  Patient presents with  . Wound Check    HPI Linda Robertson is a 46 y.o. female.   Patient was evaluated here two days ago for a cat claw injury to her right index finger and started on Augmentin.  She reports that her finger is more swollen and painful today.  No fevers, chills, and sweats.      Past Medical History:  Diagnosis Date  . Anxiety     Patient Active Problem List   Diagnosis Date Noted  . Heel pain, bilateral 06/01/2016  . Post-menopausal 03/16/2016  . Vitamin D insufficiency 03/16/2016  . Insomnia 03/16/2016  . Dizziness 03/16/2016  . No energy 03/16/2016  . Hot flashes 03/16/2016  . Panic disorder 08/16/2014  . Left ankle injury 12/06/2011  . Right shoulder pain 11/29/2011    Past Surgical History:  Procedure Laterality Date  . APPENDECTOMY    . BREAST ENHANCEMENT SURGERY      OB History    No data available       Home Medications    Prior to Admission medications   Medication Sig Start Date End Date Taking? Authorizing Provider  ALPRAZolam Prudy Feeler) 0.5 MG tablet Take 1 tablet (0.5 mg total) by mouth 2 (two) times daily as needed for anxiety. 03/28/17   Breeback, Jade L, PA-C  amoxicillin-clavulanate (AUGMENTIN) 875-125 MG tablet Take 1 tablet by mouth 2 (two) times daily. One po bid x 7 days 05/10/17   Junius Finner, PA-C  doxycycline (VIBRAMYCIN) 100 MG capsule Take 1 capsule (100 mg total) by mouth 2 (two) times daily. Take with food. 05/12/17   Lattie Haw, MD  sertraline (ZOLOFT) 50 MG tablet Take 1 tablet (50 mg total) by mouth daily. 11/29/16   Breeback, Lonna Cobb, PA-C  traZODone (DESYREL) 50 MG tablet Take 0.5-1 tablets (25-50 mg total) by mouth at bedtime as needed for sleep. 03/15/16   Jomarie Longs, PA-C    Family History Family History  Problem Relation Age of Onset  . Heart attack Father   .  Sudden death Father   . Heart failure Father   . Heart attack Maternal Grandmother   . Congestive Heart Failure Maternal Grandmother   . Atrial fibrillation Mother   . Heart attack Maternal Grandfather   . Heart attack Paternal Grandmother   . Diabetes Neg Hx   . Hyperlipidemia Neg Hx     Social History Social History  Substance Use Topics  . Smoking status: Never Smoker  . Smokeless tobacco: Never Used  . Alcohol use Yes     Comment: Social     Allergies   Codeine   Review of Systems Review of Systems  Constitutional: Negative for activity change, appetite change, chills, diaphoresis, fatigue and fever.  Skin: Positive for color change and wound.  All other systems reviewed and are negative.    Physical Exam Triage Vital Signs ED Triage Vitals  Enc Vitals Group     BP 05/12/17 1143 118/73     Pulse Rate 05/12/17 1143 (!) 57     Resp 05/12/17 1143 16     Temp 05/12/17 1143 97.4 F (36.3 C)     Temp Source 05/12/17 1143 Oral     SpO2 05/12/17 1143 100 %     Weight --      Height --  Head Circumference --      Peak Flow --      Pain Score 05/12/17 1144 3     Pain Loc --      Pain Edu? --      Excl. in GC? --    No data found.   Updated Vital Signs BP 118/73 (BP Location: Left Arm)   Pulse (!) 57   Temp 97.4 F (36.3 C) (Oral)   Resp 16   SpO2 100%   Visual Acuity Right Eye Distance:   Left Eye Distance:   Bilateral Distance:    Right Eye Near:   Left Eye Near:    Bilateral Near:     Physical Exam  Constitutional: She appears well-developed and well-nourished. No distress.  HENT:  Head: Atraumatic.  Mouth/Throat: Oropharynx is clear and moist.  Eyes: Pupils are equal, round, and reactive to light.  Neck: Neck supple.  Cardiovascular: Normal rate.   Pulmonary/Chest: Effort normal.  Musculoskeletal:       Right hand: She exhibits decreased range of motion, tenderness and swelling. She exhibits normal two-point discrimination.        Hands: Right second finger is erythematous and mildly swollen as noted on diagram.  Tenderness extends to MCP joint and distal second metacarpal.  No open wound present.  Distal neurovascular function is intact.   Lymphadenopathy:    She has no cervical adenopathy.  Neurological: She is alert.  Nursing note and vitals reviewed.    UC Treatments / Results  Labs (all labs ordered are listed, but only abnormal results are displayed) Labs Reviewed - No data to display  EKG  EKG Interpretation None       Radiology No results found.  Procedures Procedures (including critical care time)  Medications Ordered in UC Medications - No data to display   Initial Impression / Assessment and Plan / UC Course  I have reviewed the triage vital signs and the nursing notes.  Pertinent labs & imaging results that were available during my care of the patient were reviewed by me and considered in my medical decision making (see chart for details).    Note that original injury was a claw puncture rather than a mouth bite.  Will switch to doxycycline for better staph coverage. Stop Augementin and begin doxycycline.  Elevate hand.  Begin warm soaks 3 or 4 times daily for about 20 minutes.  Recommend taking a probiotic such as yogurt with live culture.  May continue ibuprofen.  If symptoms become significantly worse during the night or over the weekend, proceed to the local emergency room.     Final Clinical Impressions(s) / UC Diagnoses   Final diagnoses:  Cellulitis of finger of right hand    New Prescriptions New Prescriptions   DOXYCYCLINE (VIBRAMYCIN) 100 MG CAPSULE    Take 1 capsule (100 mg total) by mouth 2 (two) times daily. Take with food.     Lattie HawBeese, Stephen A, MD 05/23/17 [phone]

## 2017-05-12 NOTE — Telephone Encounter (Signed)
Patient states site of bite/scratch is worsening. Advised to come here or pcp for evaluation.

## 2017-05-12 NOTE — Telephone Encounter (Signed)
Inquired about patient's status; encourage them to call with questions/concerns.  

## 2017-05-12 NOTE — ED Triage Notes (Signed)
Patient here for follow up of cat bite 05-10-17 for treatment; upon phone call today she said right index finger more painful, larger in size and changing color. Encouraged her to come for evaluation.

## 2017-07-08 ENCOUNTER — Other Ambulatory Visit: Payer: Self-pay | Admitting: Physician Assistant

## 2017-07-08 DIAGNOSIS — F410 Panic disorder [episodic paroxysmal anxiety] without agoraphobia: Secondary | ICD-10-CM

## 2017-07-14 ENCOUNTER — Ambulatory Visit (INDEPENDENT_AMBULATORY_CARE_PROVIDER_SITE_OTHER): Payer: 59 | Admitting: Family Medicine

## 2017-07-14 ENCOUNTER — Ambulatory Visit (INDEPENDENT_AMBULATORY_CARE_PROVIDER_SITE_OTHER): Payer: 59

## 2017-07-14 ENCOUNTER — Encounter: Payer: Self-pay | Admitting: Family Medicine

## 2017-07-14 DIAGNOSIS — R05 Cough: Secondary | ICD-10-CM

## 2017-07-14 DIAGNOSIS — R079 Chest pain, unspecified: Secondary | ICD-10-CM

## 2017-07-14 LAB — CBC
HEMATOCRIT: 42.2 % (ref 35.0–45.0)
Hemoglobin: 14.2 g/dL (ref 11.7–15.5)
MCH: 33.1 pg — ABNORMAL HIGH (ref 27.0–33.0)
MCHC: 33.6 g/dL (ref 32.0–36.0)
MCV: 98.4 fL (ref 80.0–100.0)
MPV: 9.2 fL (ref 7.5–12.5)
Platelets: 322 10*3/uL (ref 140–400)
RBC: 4.29 MIL/uL (ref 3.80–5.10)
RDW: 13.5 % (ref 11.0–15.0)
WBC: 9.2 10*3/uL (ref 3.8–10.8)

## 2017-07-14 NOTE — Patient Instructions (Signed)
Thank you for coming in today. Get labs and xray today.  You should hear from Cardiology soon.  Let me know if you do not hear anything.   Moderate exercise is OK.  Return if worse.   Call or go to the emergency room if you get worse, have trouble breathing, have chest pains, or palpitations.

## 2017-07-14 NOTE — Progress Notes (Signed)
Linda Robertson is a 46 y.o. female who presents to [facility] today for chest pain.  Patient reports having crushing chest pain this morning. She was about 2 miles into running on the treadmill when the pain started. She characterizes the pain as substernal with pressure. She states that the pain resolved after stopping for 30 seconds. She endorses nausea and some tingling in her jaw. Patient denies any vomiting, shortness of breath, headache, lightheadedness, syncope, or vision changes at this time. Patient also reports some difficulty with sleeping lately and wakes up 2-3x per night. She denies any orthopnea, paroxysmal nocturnal dyspnea, or lower leg swelling. She has not had any recent lower leg trauma or prolonged periods of immobilization. She denies any recent gastrointestinal issues or recent changes in diet. She endorses some increased stress in her work and states that her niece passed away last week. Patient endorses increased neck and shoulder tightness for approximately 2 weeks.   Patient had a normal stress echo February 2018 at [facility] cardiology. She additionally had a Holter monitor that showed PVCs.   Past Medical History:  Diagnosis Date  . Anxiety    Past Surgical History:  Procedure Laterality Date  . APPENDECTOMY    . BREAST ENHANCEMENT SURGERY     Social History  Substance Use Topics  . Smoking status: Never Smoker  . Smokeless tobacco: Never Used  . Alcohol use Yes     Comment: Social   family history includes Atrial fibrillation in her mother; Congestive Heart Failure in her maternal grandmother; Heart attack in her father, maternal grandfather, maternal grandmother, and paternal grandmother; Heart failure in her father; Sudden death in her father.  ROS as above:  Medications: Current Outpatient Prescriptions  Medication Sig Dispense  Refill  . ALPRAZolam (XANAX) 0.5 MG tablet Take 1 tablet (0.5 mg total) by mouth 2 (two) times daily as needed for anxiety. Needs appointment for future refills. 30 tablet 0  . sertraline (ZOLOFT) 50 MG tablet Take 1 tablet (50 mg total) by mouth daily. 90 tablet 1  . traZODone (DESYREL) 50 MG tablet Take 0.5-1 tablets (25-50 mg total) by mouth at bedtime as needed for sleep. 30 tablet 0   No current facility-administered medications for this visit.    Allergies  Allergen Reactions  . Codeine Itching    Health Maintenance Health Maintenance  Topic Date Due  . HIV Screening  06/20/1985  . MAMMOGRAM  12/27/2013  . PAP SMEAR  06/26/2017  . INFLUENZA VACCINE  08/30/2017 (Originally 07/27/2017)  . TETANUS/TDAP  12/27/2018     Exam:  BP 126/77   Pulse 68   Ht 5' 6.5" (1.689 m)   Wt 144 lb (65.3 kg)   BMI 22.89 kg/m  Gen: Well NAD HEENT: EOMI,  MMM Lungs: Normal work of breathing. CTABL Heart: RRR, normal S1 and S2, no MRG Abd: NABS, Soft. Nondistended, Nontender Exts: Brisk capillary refill, warm and well perfused. No edema present.   12-lead EKG shows sinus bradycardia at 54 bpm. No ST segment elevation or depression. No Q waves. Normal EKG.   No results found for this or any previous visit (from the past 72 hour(s)). No results found.    Assessment and Plan: 46 y.o. female with chest pain. Patient underwent cardiovascular workup in February, which was mostly unremarkable with occasional PVCs present. EKG in clinic today showed sinus bradycardia, no T-wave inversion or ST elevation. Chest x-ray, CBC, and  metabolic panel ordered to further evaluate for other possible etiologies of pain.   Symptoms are unlikely to be significant cardiac related however her symptoms today were concerning.  I think it may make sense to have another cardiology evaluation. She may benefit from a Nuclear Stress test.   Patient instructed to avoid intense exercise regimen until additional workup  is completed. She will follow-up with cardiologist in 2-3 weeks and was instructed to call clinic sooner if she has any new or worsening symptoms.     Orders Placed This Encounter  Procedures  . DG Chest 2 View    Order Specific Question:   Reason for exam:    Answer:   Cough, assess intra-thoracic pathology    Order Specific Question:   Is the patient pregnant?    Answer:   No    Order Specific Question:   Preferred imaging [facility]?    [facility]  . CBC  . COMPLETE METABOLIC PANEL WITH GFR  . Magnesium  . Ambulatory referral to Cardiology    Referral Priority:   Routine    Referral Type:   Consultation    Referral Reason:   Specialty Services Required    Requested Specialty:   Cardiology    Number of Visits Requested:   1   No orders of the defined types were placed in this encounter.    Discussed warning signs or symptoms. Please see discharge instructions. Patient expresses understanding.

## 2017-07-15 LAB — COMPLETE METABOLIC PANEL WITH GFR
ALBUMIN: 4.3 g/dL (ref 3.6–5.1)
ALK PHOS: 68 U/L (ref 33–115)
ALT: 17 U/L (ref 6–29)
AST: 19 U/L (ref 10–35)
BUN: 17 mg/dL (ref 7–25)
CO2: 21 mmol/L (ref 20–31)
CREATININE: 0.93 mg/dL (ref 0.50–1.10)
Calcium: 9.8 mg/dL (ref 8.6–10.2)
Chloride: 103 mmol/L (ref 98–110)
GFR, Est African American: 85 mL/min (ref >=60)
GFR, Est Non African American: 73 mL/min (ref >=60)
GLUCOSE: 87 mg/dL (ref 65–99)
POTASSIUM: 4.2 mmol/L (ref 3.5–5.3)
SODIUM: 140 mmol/L (ref 135–146)
Total Bilirubin: 0.5 mg/dL (ref 0.2–1.2)
Total Protein: 7.5 g/dL (ref 6.1–8.1)

## 2017-07-15 LAB — MAGNESIUM: Magnesium: 2.1 mg/dL (ref 1.5–2.5)

## 2017-07-20 NOTE — Addendum Note (Signed)
Addended by: Thom ChimesHENRY, Jenay Morici M on: 07/20/2017 10:40 AM   Modules accepted: Orders

## 2017-08-09 ENCOUNTER — Encounter: Payer: Self-pay | Admitting: Physician Assistant

## 2017-08-09 ENCOUNTER — Ambulatory Visit (INDEPENDENT_AMBULATORY_CARE_PROVIDER_SITE_OTHER): Payer: 59 | Admitting: Physician Assistant

## 2017-08-09 VITALS — BP 116/46 | HR 57 | Ht 66.0 in | Wt 142.0 lb

## 2017-08-09 DIAGNOSIS — F410 Panic disorder [episodic paroxysmal anxiety] without agoraphobia: Secondary | ICD-10-CM

## 2017-08-09 DIAGNOSIS — R0789 Other chest pain: Secondary | ICD-10-CM

## 2017-08-09 DIAGNOSIS — Z1239 Encounter for other screening for malignant neoplasm of breast: Secondary | ICD-10-CM

## 2017-08-09 DIAGNOSIS — Z1231 Encounter for screening mammogram for malignant neoplasm of breast: Secondary | ICD-10-CM

## 2017-08-09 MED ORDER — SERTRALINE HCL 50 MG PO TABS: ORAL_TABLET | ORAL | 1 refills | Status: DC

## 2017-08-09 MED ORDER — ALPRAZOLAM 0.5 MG PO TABS: 0.5000 mg | ORAL_TABLET | Freq: Two times a day (BID) | ORAL | 5 refills | Status: DC | PRN

## 2017-08-09 NOTE — Progress Notes (Signed)
   Subjective:    Patient ID: Linda Robertson M Spade, female    DOB: [birth year], 46 y.o.   MRN: [id number]  HPI  Pt is a 46 yo female who presents to the clinic for follow up. She has been seen twice for CP. Most recent is Ed on 7/25.  She is concerned because she has very extensive family history of heart attacks and disease even resulting in sudden death. Her CP can occur at rest but more often with exertion. Always left sided. Usually resolves with rest or on it's on. She had a stress echo done in 07/2016 which was essentially normal. She has not seen cardiology. She feels like her panic and anxiety is under control.    .. Family History  Problem Relation Age of Onset  . Heart attack Father   . Sudden death Father   . Heart failure Father   . Heart attack Maternal Grandmother   . Congestive Heart Failure Maternal Grandmother   . Atrial fibrillation Mother   . Heart attack Maternal Grandfather   . Heart attack Paternal Grandmother   . Diabetes Neg Hx   . Hyperlipidemia Neg Hx       Review of Systems     Objective:   Physical Exam  Constitutional: She is oriented to person, place, and time. She appears well-developed and well-nourished.  HENT:  Head: Normocephalic and atraumatic.  Cardiovascular: Normal rate, regular rhythm and normal heart sounds.   Pulmonary/Chest: Effort normal and breath sounds normal.  Neurological: She is alert and oriented to person, place, and time.  Psychiatric: She has a normal mood and affect. Her behavior is normal.          Assessment & Plan:  Marland Kitchen.Marland Kitchen.Misty StanleyLisa was seen today for annual exam.  Diagnoses and all orders for this visit:  Atypical chest pain -     Ambulatory referral to Cardiology  Breast cancer screening -     MM DIGITAL SCREENING BILATERAL; Future  Panic disorder -     sertraline (ZOLOFT) 50 MG tablet; Take one and one half tablet daily. -     ALPRAZolam (XANAX) 0.5 MG tablet; Take 1 tablet (0.5 mg total) by mouth 2 (two) times daily as  needed for anxiety.  Other orders -     Cancel: Cytology - PAP   Unclear if anxiety is causing worsening CP. Will increase zoloft to 75mg  daily. Follow up in 4 weeks after cardiology.  .. [facility] 08/09/2017 11/29/2016  Decreased Interest 0 0  Down, Depressed, Hopeless 1 0  PHQ - 2 Score 1 0  Altered sleeping 1 2  Tired, decreased energy 1 1  Change in appetite 0 0  Feeling bad or failure about yourself  0 0  Trouble concentrating 0 0  Moving slowly or fidgety/restless 0 0  Suicidal thoughts 0 0  PHQ-9 Score 3 3

## 2017-08-11 ENCOUNTER — Telehealth: Payer: Self-pay | Admitting: Physician Assistant

## 2017-08-11 DIAGNOSIS — R0789 Other chest pain: Secondary | ICD-10-CM | POA: Insufficient documentation

## 2017-08-11 NOTE — Telephone Encounter (Signed)
Call pt: Let patient know I orginally stated I would order another stress echo to have done before cardiology. I would now like you to see cardiology before any more testing. That way the best test could be ordered. Referral has been made and hopefullyyou can get in soon.

## 2017-08-15 NOTE — Telephone Encounter (Signed)
Called the patient to give advise but a recording came on that stated patient number listed is no longer in service. Rhonda Cunningham,CMA

## 2017-08-23 ENCOUNTER — Telehealth: Payer: Self-pay | Admitting: Physician Assistant

## 2017-08-23 DIAGNOSIS — R0789 Other chest pain: Secondary | ICD-10-CM

## 2017-08-23 NOTE — Telephone Encounter (Signed)
Patient called. She states Provider was supposed to be putting in an Order for her to get a Stress Test and she hadn't heard anything. At the moment, I do not see where  an Order has not been placed.  Thank you

## 2017-08-24 ENCOUNTER — Other Ambulatory Visit: Payer: Self-pay | Admitting: Physician Assistant

## 2017-08-24 DIAGNOSIS — Z1239 Encounter for other screening for malignant neoplasm of breast: Secondary | ICD-10-CM

## 2017-08-24 DIAGNOSIS — R0789 Other chest pain: Secondary | ICD-10-CM

## 2017-08-24 NOTE — Telephone Encounter (Signed)
Pt advised. Verbalized understanding.

## 2017-08-24 NOTE — Telephone Encounter (Signed)
Thank you for calling. Inadvertently not put in. I placed today. Do you have appt with cardiology yet?

## 2017-09-01 NOTE — Progress Notes (Signed)
Referring-Jade L Breeback, PA-C Reason for referral-Chest pain  HPI: 46 year old female for evaluation of chest pain at request of Jomarie LongsJade L Breeback, PA-C. Stress echocardiogram February 2017 normal. Monitor Feb 2017 showed PVCs and some sinus tachycardia by report. Chest x-ray July 2018 negative. Patient has anxiety by her report. She has had occasional chest pain for several years. Recently this has concerned her more. She had 2 episodes of sharp pain in the substernal area while exercising lasting for several seconds. No radiation. No associated symptoms. Resolved spontaneously. She notes some dyspnea on exertion but no orthopnea, PND, pedal edema or syncope. Occasional brief palpitations described as a skip and pause. No sustained palpitations. Because of the above we were asked to evaluate.  Current Outpatient Prescriptions  Medication Sig Dispense Refill  . ALPRAZolam (XANAX) 0.5 MG tablet Take 1 tablet (0.5 mg total) by mouth 2 (two) times daily as needed for anxiety. 30 tablet 5  . sertraline (ZOLOFT) 50 MG tablet Take one and one half tablet daily. 135 tablet 1  . traZODone (DESYREL) 50 MG tablet Take 0.5-1 tablets (25-50 mg total) by mouth at bedtime as needed for sleep. 30 tablet 0   No current facility-administered medications for this visit.     Allergies  Allergen Reactions  . Codeine Itching     Past Medical History:  Diagnosis Date  . Anxiety     Past Surgical History:  Procedure Laterality Date  . APPENDECTOMY    . BREAST ENHANCEMENT SURGERY      Social History   Social History  . Marital status: Divorced    Spouse name: N/A  . Number of children: 3  . Years of education: N/A   Occupational History  .      Emergency planning/management officerroject manager   Social History Main Topics  . Smoking status: Never Smoker  . Smokeless tobacco: Never Used  . Alcohol use Yes     Comment: Social  . Drug use: No  . Sexual activity: Yes   Other Topics Concern  . Not on file   Social  History Narrative  . No narrative on file    Family History  Problem Relation Age of Onset  . Heart attack Father   . Sudden death Father [id number]  . Heart failure Father   . Heart attack Maternal Grandmother   . Congestive Heart Failure Maternal Grandmother   . Atrial fibrillation Mother   . Heart attack Maternal Grandfather   . Heart attack Paternal Grandmother   . Diabetes Neg Hx   . Hyperlipidemia Neg Hx     ROS: fatigue but no fevers or chills, productive cough, hemoptysis, dysphasia, odynophagia, melena, hematochezia, dysuria, hematuria, rash, seizure activity, orthopnea, PND, pedal edema, claudication. Remaining systems are negative.  Physical Exam:   Blood pressure 109/75, pulse 76, height 5\' 6"  (1.676 m), weight 65.8 kg (145 lb 1.9 oz).  General:  Well developed/well nourished in NAD Skin warm/dry Patient not depressed No peripheral clubbing Back-normal HEENT-normal/normal eyelids Neck supple/normal carotid upstroke bilaterally; no bruits; no JVD; no thyromegaly chest - CTA/ normal expansion CV - RRR/normal S1 and S2; no murmurs, rubs or gallops;  PMI nondisplaced Abdomen -NT/ND, no HSM, no mass, + bowel sounds, no bruit 2+ femoral pulses, no bruits Ext-no edema, chords, 2+ DP Neuro-grossly nonfocal  ECG -normal sinus rhythm at a rate of 76. No ST changes. personally reviewed  A/P  1 chest pain-symptoms atypical but patient extremely concerned. I will arrange an exercise treadmill for  risk stratification.  2 palpitations-likely PACs or PVCs. Could consider beta blocker in the future as needed.  3 fatigue-recent hemoglobin normal. Check TSH.   4 Anxiety-per primary care.  Olga Millers, MD

## 2017-09-07 ENCOUNTER — Ambulatory Visit (INDEPENDENT_AMBULATORY_CARE_PROVIDER_SITE_OTHER): Payer: 59 | Admitting: Cardiology

## 2017-09-07 ENCOUNTER — Encounter: Payer: Self-pay | Admitting: Cardiology

## 2017-09-07 VITALS — BP 109/75 | HR 76 | Ht 66.0 in | Wt 145.1 lb

## 2017-09-07 DIAGNOSIS — R072 Precordial pain: Secondary | ICD-10-CM | POA: Diagnosis not present

## 2017-09-07 DIAGNOSIS — R002 Palpitations: Secondary | ICD-10-CM

## 2017-09-07 DIAGNOSIS — R5383 Other fatigue: Secondary | ICD-10-CM | POA: Diagnosis not present

## 2017-09-07 NOTE — Patient Instructions (Signed)
Medication Instructions:   NO CHANGE  Labwork:  Your physician recommends that you HAVE LAB WORK TODAY  Testing/Procedures:  Your physician has requested that you have an exercise tolerance test. For further information please visit [email or web address]. Please also follow instruction sheet, as given.    Follow-Up:  Your physician wants you to follow-up in: ONE YEAR WITH DR Shelda PalRENSHAW You will receive a reminder letter in the mail two months in advance. If you don't receive a letter, please call our office to schedule the follow-up appointment.    Exercise Stress Electrocardiogram An exercise stress electrocardiogram is a test to check how blood flows to your heart. It is done to find areas of poor blood flow. You will need to walk on a treadmill for this test. The electrocardiogram will record your heartbeat when you are at rest and when you are exercising. What happens before the procedure?  Do not have drinks with caffeine or foods with caffeine for 24 hours before the test, or as told by your doctor. This includes coffee, tea (even decaf tea), sodas, chocolate, and cocoa.  Follow your doctor's instructions about eating and drinking before the test.  Ask your doctor what medicines you should or should not take before the test. Take your medicines with water unless told by your doctor not to.  If you use an inhaler, bring it with you to the test.  Bring a snack to eat after the test.  Do not  smoke for 4 hours before the test.  Do not put lotions, powders, creams, or oils on your chest before the test.  Wear comfortable shoes and clothing. What happens during the procedure?  You will have patches put on your chest. Small areas of your chest may need to be shaved. Wires will be connected to the patches.  Your heart rate will be watched while you are resting and while you are exercising.  You will walk on the treadmill. The treadmill will slowly get faster to raise your heart  rate.  The test will take about 1-2 hours. What happens after the procedure?  Your heart rate and blood pressure will be watched after the test.  You may return to your normal diet, activities, and medicines or as told by your doctor. This information is not intended to replace advice given to you by your health care provider. Make sure you discuss any questions you have with your health care provider. Document Released: 05/31/2008 Document Revised: 08/11/2016 Document Reviewed: 08/20/2013 Elsevier Interactive Patient Education  [employer]2018 Elsevier Inc.

## 2017-09-08 ENCOUNTER — Telehealth (HOSPITAL_COMMUNITY): Payer: Self-pay

## 2017-09-08 NOTE — Telephone Encounter (Signed)
Encounter complete. 

## 2017-09-09 ENCOUNTER — Ambulatory Visit (HOSPITAL_COMMUNITY)
Admission: RE | Admit: 2017-09-09 | Discharge: 2017-09-09 | Disposition: A | Discharge status: Home / Self Care | Payer: 59 | Source: Ambulatory Visit | Attending: Cardiology | Admitting: Cardiology

## 2017-09-09 DIAGNOSIS — R072 Precordial pain: Secondary | ICD-10-CM

## 2017-09-09 LAB — EXERCISE TOLERANCE TEST
CHL RATE OF PERCEIVED EXERTION: 18
CSEPHR: 98 %
Estimated workload: 13.4 METS
Exercise duration (min): 11 min
Exercise duration (sec): 0 s
MPHR: 173 {beats}/min
Peak HR: 171 {beats}/min
Rest HR: 59 {beats}/min

## 2018-01-17 ENCOUNTER — Other Ambulatory Visit: Payer: Self-pay | Admitting: Physician Assistant

## 2018-01-17 DIAGNOSIS — F410 Panic disorder [episodic paroxysmal anxiety] without agoraphobia: Secondary | ICD-10-CM

## 2018-04-22 ENCOUNTER — Other Ambulatory Visit: Payer: Self-pay | Admitting: Physician Assistant

## 2018-04-22 DIAGNOSIS — F410 Panic disorder [episodic paroxysmal anxiety] without agoraphobia: Secondary | ICD-10-CM

## 2018-04-24 NOTE — Telephone Encounter (Signed)
Linda Robertson requesting refill for pt's Xanax. Last RX sent 01-18-18 for #30 with 2 refills.  Last OV was 08-13-17 and no upcoming appts scheduled.   RX pended, please review and send if appropriate

## 2018-05-31 ENCOUNTER — Other Ambulatory Visit: Payer: Self-pay | Admitting: Physician Assistant

## 2018-05-31 DIAGNOSIS — F410 Panic disorder [episodic paroxysmal anxiety] without agoraphobia: Secondary | ICD-10-CM

## 2018-07-11 ENCOUNTER — Other Ambulatory Visit: Payer: Self-pay | Admitting: Physician Assistant

## 2018-07-11 DIAGNOSIS — F410 Panic disorder [episodic paroxysmal anxiety] without agoraphobia: Secondary | ICD-10-CM

## 2018-07-11 NOTE — Telephone Encounter (Signed)
Needs follow up. Ok for one more month.

## 2018-07-12 ENCOUNTER — Other Ambulatory Visit: Payer: Self-pay | Admitting: Physician Assistant

## 2018-07-12 DIAGNOSIS — F410 Panic disorder [episodic paroxysmal anxiety] without agoraphobia: Secondary | ICD-10-CM

## 2018-08-22 ENCOUNTER — Other Ambulatory Visit: Payer: Self-pay | Admitting: Physician Assistant

## 2018-08-22 DIAGNOSIS — F410 Panic disorder [episodic paroxysmal anxiety] without agoraphobia: Secondary | ICD-10-CM

## 2018-08-23 ENCOUNTER — Telehealth: Payer: Self-pay | Admitting: Physician Assistant

## 2018-08-23 DIAGNOSIS — F410 Panic disorder [episodic paroxysmal anxiety] without agoraphobia: Secondary | ICD-10-CM

## 2018-08-23 MED ORDER — ALPRAZOLAM 0.5 MG PO TABS: ORAL_TABLET | ORAL | 0 refills | Status: DC

## 2018-08-23 NOTE — Telephone Encounter (Signed)
Pt last refill denied because she needs an appt. She is scheduled for next week but only has 2 tabs left. Requesting refill to last until then. Routing.

## 2018-08-23 NOTE — Telephone Encounter (Signed)
Done

## 2018-08-30 ENCOUNTER — Ambulatory Visit: Payer: 59 | Admitting: Physician Assistant

## 2018-09-04 ENCOUNTER — Encounter: Payer: Self-pay | Admitting: Physician Assistant

## 2018-09-04 ENCOUNTER — Ambulatory Visit (INDEPENDENT_AMBULATORY_CARE_PROVIDER_SITE_OTHER): Payer: Self-pay | Admitting: Physician Assistant

## 2018-09-04 VITALS — BP 116/53 | HR 68 | Ht 66.0 in | Wt 151.0 lb

## 2018-09-04 DIAGNOSIS — F410 Panic disorder [episodic paroxysmal anxiety] without agoraphobia: Secondary | ICD-10-CM

## 2018-09-04 DIAGNOSIS — R232 Flushing: Secondary | ICD-10-CM

## 2018-09-04 DIAGNOSIS — Z131 Encounter for screening for diabetes mellitus: Secondary | ICD-10-CM

## 2018-09-04 DIAGNOSIS — Z1231 Encounter for screening mammogram for malignant neoplasm of breast: Secondary | ICD-10-CM

## 2018-09-04 DIAGNOSIS — Z13220 Encounter for screening for lipoid disorders: Secondary | ICD-10-CM

## 2018-09-04 DIAGNOSIS — Z0183 Encounter for blood typing: Secondary | ICD-10-CM

## 2018-09-04 MED ORDER — ALPRAZOLAM 0.5 MG PO TABS
ORAL_TABLET | ORAL | 5 refills | Status: DC
Start: 2018-09-04 — End: 2019-04-09

## 2018-09-04 MED ORDER — SERTRALINE HCL 50 MG PO TABS: ORAL_TABLET | ORAL | 1 refills | Status: DC

## 2018-09-04 NOTE — Patient Instructions (Signed)
Menopause and Herbal Products What is menopause? Menopause is the normal time of life when menstrual periods decrease in frequency and eventually stop completely. This process can take several years for some women. Menopause is complete when you have had an absence of menstruation for a full year since your last menstrual period. It usually occurs between the ages of 48 and 55. It is not common for menopause to begin before the age of 40. During menopause, your body stops producing the female hormones estrogen and progesterone. Common symptoms associated with this loss of hormones (vasomotor symptoms) are:  Hot flashes.  Hot flushes.  Night sweats.  Other common symptoms and complications of menopause include:  Decrease in sex drive.  Vaginal dryness and thinning of the walls of the vagina. This can make sex painful.  Dryness of the skin and development of wrinkles.  Headaches.  Tiredness.  Irritability.  Memory problems.  Weight gain.  Bladder infections.  Hair growth on the face and chest.  Inability to reproduce offspring (infertility).  Loss of density in the bones (osteoporosis) increasing your risk for breaks (fractures).  Depression.  Hardening and narrowing of the arteries (atherosclerosis). This increases your risk of heart attack and stroke.  What treatment options are available? There are many treatment choices for menopause symptoms. The most common treatment is hormone replacement therapy. Many alternative therapies for menopause are emerging, including the use of herbal products. These supplements can be found in the form of herbs, teas, oils, tinctures, and pills. Common herbal supplements for menopause are made from plants that contain phytoestrogens. Phytoestrogens are compounds that occur naturally in plants and plant products. They act like estrogen in the body. Foods and herbs that contain phytoestrogens include:  Soy.  Flax seeds.  Red  clover.  Ginseng.  What menopause symptoms may be helped if I use herbal products?  Vasomotor symptoms. These may be helped by: ? Soy. Some studies show that soy may have a moderate benefit for hot flashes. ? Black cohosh. There is limited evidence indicating this may be beneficial for hot flashes.  Symptoms that are related to heart and blood vessel disease. These may be helped by soy. Studies have shown that soy can help to lower cholesterol.  Depression. This may be helped by: ? St. John's wort. There is limited evidence that shows this may help mild to moderate depression. ? Black cohosh. There is evidence that this may help depression and mood swings.  Osteoporosis. Soy may help to decrease bone loss that is associated with menopause and may prevent osteoporosis. Limited evidence indicates that red clover may offer some bone loss protection as well. Other herbal products that are commonly used during menopause lack enough evidence to support their use as a replacement for conventional menopause therapies. These products include evening primrose, ginseng, and red clover. What are the cases when herbal products should not be used during menopause? Do not use herbal products during menopause without your health care provider's approval if:  You are taking medicine.  You have a preexisting liver condition.  Are there any risks in my taking herbal products during menopause? If you choose to use herbal products to help with symptoms of menopause, keep in mind that:  Different supplements have different and unmeasured amounts of herbal ingredients.  Herbal products are not regulated the same way that medicines are.  Concentrations of herbs may vary depending on the way they are prepared. For example, the concentration may be different in a pill,   tea, oil, and tincture.  Little is known about the risks of using herbal products, particularly the risks of long-term use.  Some herbal  supplements can be harmful when combined with certain medicines.  Most commonly reported side effects of herbal products are mild. However, if used improperly, many herbal supplements can cause serious problems. Talk to your health care provider before starting any herbal product. If problems develop, stop taking the supplement and let your health care provider know. This information is not intended to replace advice given to you by your health care provider. Make sure you discuss any questions you have with your health care provider. Document Released: 05/31/2008 Document Revised: 11/09/2016 Document Reviewed: 05/28/2014 Elsevier Interactive Patient Education  2017 Elsevier Inc.   Restless Legs Syndrome Restless legs syndrome is a condition that causes uncomfortable feelings or sensations in the legs, especially while sitting or lying down. The sensations usually cause an overwhelming urge to move the legs. The arms can also sometimes be affected. The condition can range from mild to severe. The symptoms often interfere with a person's ability to sleep. What are the causes? The cause of this condition is not known. What increases the risk? This condition is more likely to develop in:  People who are older than age 27.  Pregnant women. In general, restless legs syndrome is more common in women than in men.  People who have a family history of the condition.  People who have certain medical conditions, such as iron deficiency, kidney disease, Parkinson disease, or nerve damage.  People who take certain medicines, such as medicines for high blood pressure, nausea, colds, allergies, depression, and some heart conditions.  What are the signs or symptoms? The main symptom of this condition is uncomfortable sensations in the legs. These sensations may be:  Described as pulling, tingling, prickling, throbbing, crawling, or burning.  Worse while you are sitting or lying down.  Worse during  periods of rest or inactivity.  Worse at night, often interfering with your sleep.  Accompanied by a very strong urge to move your legs.  Temporarily relieved by movement of your legs.  The sensations usually affect both sides of the body. The arms can also be affected, but this is rare. People who have this condition often have tiredness during the day because of their lack of sleep at night. How is this diagnosed? This condition may be diagnosed based on your description of the symptoms. You may also have tests, including blood tests, to check for other conditions that may lead to your symptoms. In some cases, you may be asked to spend some time in a sleep lab so your sleeping can be monitored. How is this treated? Treatment for this condition is focused on managing the symptoms. Treatment may include:  Self-help and lifestyle changes.  Medicines.  Follow these instructions at home:  Take medicines only as directed by your health care provider.  Try these methods to get temporary relief from the uncomfortable sensations: ? Massage your legs. ? Walk or stretch. ? Take a cold or hot bath.  Practice good sleep habits. For example, go to bed and get up at the same time every day.  Exercise regularly.  Practice ways of relaxing, such as yoga or meditation.  Avoid caffeine and alcohol.  Do not use any tobacco products, including cigarettes, chewing tobacco, or electronic cigarettes. If you need help quitting, ask your health care provider.  Keep all follow-up visits as directed by your health care  provider. This is important. Contact a health care provider if: Your symptoms do not improve with treatment, or they get worse. This information is not intended to replace advice given to you by your health care provider. Make sure you discuss any questions you have with your health care provider. Document Released: 12/03/2002 Document Revised: 05/20/2016 Document Reviewed:  12/09/2014 Elsevier Interactive Patient Education  [employer].

## 2018-09-04 NOTE — Progress Notes (Signed)
Subjective:    Patient ID: Linda Robertson, female    DOB: February 20, 1971, 47 y.o.   MRN: [id number]  HPI  Pt is a 47 yo post menopausal female with panic disorder who presents to the clinic to get medication refill.   She has had a lot of changes over the past year. She has broken up with a serious boyfriend, changed jobs, moved houses. She continues to struggle with hot flashes, weight gain, anxiety, depression. She has tried trazodone, melatonin, zzquil, CBD oil.  All sleep medications maker her feel a little off in the am. She is taking xanax but not every day and usually a half one. She is taking zoloft daily. She does admit to restless leg symptoms.   .. Active Ambulatory Problems    Diagnosis Date Noted  . Right shoulder pain 11/29/2011  . Left ankle injury 12/06/2011  . Panic disorder 08/16/2014  . Post-menopausal 03/16/2016  . Vitamin D insufficiency 03/16/2016  . Insomnia 03/16/2016  . Dizziness 03/16/2016  . No energy 03/16/2016  . Hot flashes 03/16/2016  . Heel pain, bilateral 06/01/2016  . Chest pain 07/14/2017  . Atypical chest pain 08/11/2017   Resolved Ambulatory Problems    Diagnosis Date Noted  . No Resolved Ambulatory Problems   Past Medical History:  Diagnosis Date  . Anxiety      Review of Systems  All other systems reviewed and are negative.      Objective:   Physical Exam  Constitutional: She appears well-developed and well-nourished.  HENT:  Head: Normocephalic and atraumatic.  Cardiovascular: Normal rate and regular rhythm.  Pulmonary/Chest: Effort normal and breath sounds normal.  Psychiatric: She has a normal mood and affect. Her behavior is normal.          Assessment & Plan:  Marland KitchenMarland KitchenDiagnoses and all orders for this visit:  Screening for lipid disorders -     Lipid Panel w/reflex Direct LDL  Panic disorder -     ALPRAZolam (XANAX) 0.5 MG tablet; TAKE ONE TABLET BY MOUTH TWICE A DAY AS NEEDED FOR ANXIETY -     sertraline (ZOLOFT) 50  MG tablet; Take one and one half tablet daily.  Screening for diabetes mellitus -     COMPLETE METABOLIC PANEL WITH GFR  Hot flashes -     TSH  Encounter for blood typing -     ABO AND RH   Visit for screening mammogram -     MS DIGITAL SCREENING TOMO W/IMPLANTS BILATERAL    Depression screen [facility] 2/9 09/04/2018 08/09/2017 11/29/2016  Decreased Interest 0 0 0  Down, Depressed, Hopeless 0 1 0  PHQ - 2 Score 0 1 0  Altered sleeping 1 1 2   Tired, decreased energy 1 1 1   Change in appetite 0 0 0  Feeling bad or failure about yourself  0 0 0  Trouble concentrating 0 0 0  Moving slowly or fidgety/restless 0 0 0  Suicidal thoughts 0 0 0  PHQ-9 Score 2 3 3   Difficult doing work/chores Not difficult at all - -   .Marland Kitchen GAD 7 : Generalized Anxiety Score 09/04/2018 11/29/2016  Nervous, Anxious, on Edge 1 2  Control/stop worrying 1 1  Worry too much - different things 1 1  Trouble relaxing 1 1  Restless 0 0  Easily annoyed or irritable 1 0  Afraid - awful might happen 1 0  Total GAD 7 Score 6 5  Anxiety Difficulty Not difficult at all Not difficult at  all   Medications refilled. Follow up in 6 months. Discussed CBT ways to work on sleep. HO given on RLS. Declines medication discussion. Consider increasing iron.   Pap smear done at [facility] in [place]. Will send over for report.  Ordered mammogram.  Mammogram ordered today.  Fasting labs ordered today.

## 2019-03-08 ENCOUNTER — Other Ambulatory Visit: Payer: Self-pay | Admitting: Physician Assistant

## 2019-03-08 DIAGNOSIS — F410 Panic disorder [episodic paroxysmal anxiety] without agoraphobia: Secondary | ICD-10-CM

## 2019-04-05 ENCOUNTER — Other Ambulatory Visit: Payer: Self-pay | Admitting: Physician Assistant

## 2019-04-05 DIAGNOSIS — F410 Panic disorder [episodic paroxysmal anxiety] without agoraphobia: Secondary | ICD-10-CM

## 2019-04-05 NOTE — Telephone Encounter (Signed)
Left pt msg advising her she must schedule appt for refills

## 2019-04-09 ENCOUNTER — Ambulatory Visit (INDEPENDENT_AMBULATORY_CARE_PROVIDER_SITE_OTHER): Payer: Self-pay | Admitting: Physician Assistant

## 2019-04-09 ENCOUNTER — Encounter: Payer: Self-pay | Admitting: Physician Assistant

## 2019-04-09 DIAGNOSIS — F410 Panic disorder [episodic paroxysmal anxiety] without agoraphobia: Secondary | ICD-10-CM

## 2019-04-09 MED ORDER — SERTRALINE HCL 50 MG PO TABS: ORAL_TABLET | ORAL | 3 refills | Status: DC

## 2019-04-09 MED ORDER — ALPRAZOLAM 0.5 MG PO TABS: ORAL_TABLET | ORAL | 5 refills | Status: DC

## 2019-04-09 NOTE — Progress Notes (Signed)
Patient ID: Linda Robertson, female   DOB: 01-22-1971, 48 y.o.   MRN: [id number] .Marland Kitchen.Virtual Visit via Video Note  I connected with Linda Robertson on 04/09/19 at 11:10 AM EDT by a video enabled telemedicine application and verified that I am speaking with the correct person using two identifiers.   I discussed the limitations of evaluation and management by telemedicine and the availability of in person appointments. The patient expressed understanding and agreed to proceed.  History of Present Illness: Pt is a 48 yo female with panic disorder who needs refills on medication. She is currently taking zoloft and xanax. She is doing really well. No SI/HC. She is having 1-2 panic attacks a month which is way down. On average she is using xanax every other day. No problems or concerns.   .. Active Ambulatory Problems    Diagnosis Date Noted  . Right shoulder pain 11/29/2011  . Left ankle injury 12/06/2011  . Panic disorder 08/16/2014  . Post-menopausal 03/16/2016  . Vitamin D insufficiency 03/16/2016  . Insomnia 03/16/2016  . Dizziness 03/16/2016  . No energy 03/16/2016  . Hot flashes 03/16/2016  . Heel pain, bilateral 06/01/2016  . Chest pain 07/14/2017  . Atypical chest pain 08/11/2017   Resolved Ambulatory Problems    Diagnosis Date Noted  . No Resolved Ambulatory Problems   Past Medical History:  Diagnosis Date  . Anxiety    reviewed med, allergy, problem list.     Observations/Objective: No acute distress  .Marland Kitchen. Today's Vitals   04/09/19 1027  Weight: 145 lb (65.8 kg)  Height: 5\' 6"  (1.676 m)   Body mass index is 23.4 kg/m. .. [facility] 2[facility] 04/09/2019 09/04/2018 08/09/2017 11/29/2016  Decreased Interest 0 0 0 0  Down, Depressed, Hopeless 0 0 1 0  PHQ - 2 Score 0 0 1 0  Altered sleeping 3 1 1 2   Tired, decreased energy 0 1 1 1   Change in appetite 1 0 0 0  Feeling bad or failure about yourself  0 0 0 0  Trouble concentrating 0 0 0 0  Moving slowly or  fidgety/restless 0 0 0 0  Suicidal thoughts 0 0 0 0  PHQ-9 Score 4 2 3 3   Difficult doing work/chores Not difficult at all Not difficult at all - -   .Marland Kitchen. GAD 7 : Generalized Anxiety Score 04/09/2019 09/04/2018 11/29/2016  Nervous, Anxious, on Edge 0 1 2  Control/stop worrying 0 1 1  Worry too much - different things 1 1 1   Trouble relaxing 0 1 1  Restless 0 0 0  Easily annoyed or irritable 0 1 0  Afraid - awful might happen 0 1 0  Total GAD 7 Score 1 6 5   Anxiety Difficulty Not difficult at all Not difficult at all Not difficult at all      Assessment and Plan: Marland Kitchen.Marland Kitchen.Linda StanleyLisa was seen today for medication refill.  Diagnoses and all orders for this visit:  Panic disorder -     sertraline (ZOLOFT) 50 MG tablet; Take one and one half tablet daily. -     ALPRAZolam (XANAX) 0.5 MG tablet; TAKE ONE TABLET BY MOUTH TWICE A DAY AS NEEDED FOR ANXIETY   GAD and PHQ-9 are stable. Medications refilled. Follow up in 6 months.   Pt aware she needs labs and mammogram. Pt is waiting until some restrictions on COVID-19 crisis are lifted to have done.   Follow Up Instructions:    I discussed the assessment and treatment  plan with the patient. The patient was provided an opportunity to ask questions and all were answered. The patient agreed with the plan and demonstrated an understanding of the instructions.   The patient was advised to call back or seek an in-person evaluation if the symptoms worsen or if the condition fails to improve as anticipated.  I provided 10 minutes of non-face-to-face time during this encounter.   Tandy Gaw, PA-C

## 2019-11-18 ENCOUNTER — Other Ambulatory Visit: Payer: Self-pay | Admitting: Physician Assistant

## 2019-11-18 DIAGNOSIS — F410 Panic disorder [episodic paroxysmal anxiety] without agoraphobia: Secondary | ICD-10-CM

## 2019-12-30 ENCOUNTER — Other Ambulatory Visit: Payer: Self-pay | Admitting: Physician Assistant

## 2019-12-30 DIAGNOSIS — F410 Panic disorder [episodic paroxysmal anxiety] without agoraphobia: Secondary | ICD-10-CM

## 2019-12-31 NOTE — Telephone Encounter (Signed)
Last filled 11/19/2019 #30 no refills. Last refill stated needs appt. Pended for #15 with note that patient needs appt for further refills.

## 2020-01-16 ENCOUNTER — Ambulatory Visit (INDEPENDENT_AMBULATORY_CARE_PROVIDER_SITE_OTHER): Payer: Self-pay | Admitting: Physician Assistant

## 2020-01-16 DIAGNOSIS — Z5329 Procedure and treatment not carried out because of patient's decision for other reasons: Secondary | ICD-10-CM

## 2020-01-16 NOTE — Progress Notes (Signed)
No show

## 2020-01-23 ENCOUNTER — Ambulatory Visit (INDEPENDENT_AMBULATORY_CARE_PROVIDER_SITE_OTHER): Payer: 59 | Admitting: Physician Assistant

## 2020-01-23 ENCOUNTER — Ambulatory Visit (INDEPENDENT_AMBULATORY_CARE_PROVIDER_SITE_OTHER): Payer: 59

## 2020-01-23 ENCOUNTER — Other Ambulatory Visit: Payer: Self-pay

## 2020-01-23 VITALS — BP 131/68 | HR 79 | Ht 66.0 in | Wt 168.0 lb

## 2020-01-23 DIAGNOSIS — R1012 Left upper quadrant pain: Secondary | ICD-10-CM | POA: Diagnosis not present

## 2020-01-23 DIAGNOSIS — Z13220 Encounter for screening for lipoid disorders: Secondary | ICD-10-CM

## 2020-01-23 DIAGNOSIS — F410 Panic disorder [episodic paroxysmal anxiety] without agoraphobia: Secondary | ICD-10-CM

## 2020-01-23 DIAGNOSIS — R140 Abdominal distension (gaseous): Secondary | ICD-10-CM | POA: Diagnosis not present

## 2020-01-23 DIAGNOSIS — Z780 Asymptomatic menopausal state: Secondary | ICD-10-CM | POA: Diagnosis not present

## 2020-01-23 DIAGNOSIS — Z0000 Encounter for general adult medical examination without abnormal findings: Secondary | ICD-10-CM | POA: Diagnosis not present

## 2020-01-23 DIAGNOSIS — Z1231 Encounter for screening mammogram for malignant neoplasm of breast: Secondary | ICD-10-CM

## 2020-01-23 DIAGNOSIS — K5900 Constipation, unspecified: Secondary | ICD-10-CM

## 2020-01-23 DIAGNOSIS — Z0183 Encounter for blood typing: Secondary | ICD-10-CM

## 2020-01-23 DIAGNOSIS — Z131 Encounter for screening for diabetes mellitus: Secondary | ICD-10-CM

## 2020-01-23 DIAGNOSIS — R635 Abnormal weight gain: Secondary | ICD-10-CM | POA: Diagnosis not present

## 2020-01-23 DIAGNOSIS — Z79899 Other long term (current) drug therapy: Secondary | ICD-10-CM

## 2020-01-23 MED ORDER — ALPRAZOLAM 0.5 MG PO TABS: ORAL_TABLET | ORAL | 2 refills | Status: DC

## 2020-01-23 MED ORDER — SERTRALINE HCL 50 MG PO TABS: ORAL_TABLET | ORAL | 3 refills | Status: DC

## 2020-01-23 NOTE — Patient Instructions (Signed)
Health Maintenance, Female Adopting a healthy lifestyle and getting preventive care are important in promoting health and wellness. Ask your health care provider about:  The right schedule for you to have regular tests and exams.  Things you can do on your own to prevent diseases and keep yourself healthy. What should I know about diet, weight, and exercise? Eat a healthy diet   Eat a diet that includes plenty of vegetables, fruits, low-fat dairy products, and lean protein.  Do not eat a lot of foods that are high in solid fats, added sugars, or sodium. Maintain a healthy weight Body mass index (BMI) is used to identify weight problems. It estimates body fat based on height and weight. Your health care provider can help determine your BMI and help you achieve or maintain a healthy weight. Get regular exercise Get regular exercise. This is one of the most important things you can do for your health. Most adults should:  Exercise for at least 150 minutes each week. The exercise should increase your heart rate and make you sweat (moderate-intensity exercise).  Do strengthening exercises at least twice a week. This is in addition to the moderate-intensity exercise.  Spend less time sitting. Even light physical activity can be beneficial. Watch cholesterol and blood lipids Have your blood tested for lipids and cholesterol at 50 years of age, then have this test every 5 years. Have your cholesterol levels checked more often if:  Your lipid or cholesterol levels are high.  You are older than 50 years of age.  You are at high risk for heart disease. What should I know about cancer screening? Depending on your health history and family history, you may need to have cancer screening at various ages. This may include screening for:  Breast cancer.  Cervical cancer.  Colorectal cancer.  Skin cancer.  Lung cancer. What should I know about heart disease, diabetes, and high blood  pressure? Blood pressure and heart disease  High blood pressure causes heart disease and increases the risk of stroke. This is more likely to develop in people who have high blood pressure readings, are of African descent, or are overweight.  Have your blood pressure checked: ? Every 3-5 years if you are 18-39 years of age. ? Every year if you are 40 years old or older. Diabetes Have regular diabetes screenings. This checks your fasting blood sugar level. Have the screening done:  Once every three years after age 40 if you are at a normal weight and have a low risk for diabetes.  More often and at a younger age if you are overweight or have a high risk for diabetes. What should I know about preventing infection? Hepatitis B If you have a higher risk for hepatitis B, you should be screened for this virus. Talk with your health care provider to find out if you are at risk for hepatitis B infection. Hepatitis C Testing is recommended for:  Everyone born from 1945 through 1965.  Anyone with known risk factors for hepatitis C. Sexually transmitted infections (STIs)  Get screened for STIs, including gonorrhea and chlamydia, if: ? You are sexually active and are younger than 50 years of age. ? You are older than 50 years of age and your health care provider tells you that you are at risk for this type of infection. ? Your sexual activity has changed since you were last screened, and you are at increased risk for chlamydia or gonorrhea. Ask your health care provider if   you are at risk.  Ask your health care provider about whether you are at high risk for HIV. Your health care provider may recommend a prescription medicine to help prevent HIV infection. If you choose to take medicine to prevent HIV, you should first get tested for HIV. You should then be tested every 3 months for as long as you are taking the medicine. Pregnancy  If you are about to stop having your period (premenopausal) and  you may become pregnant, seek counseling before you get pregnant.  Take 400 to 800 micrograms (mcg) of folic acid every day if you become pregnant.  Ask for birth control (contraception) if you want to prevent pregnancy. Osteoporosis and menopause Osteoporosis is a disease in which the bones lose minerals and strength with aging. This can result in bone fractures. If you are 65 years old or older, or if you are at risk for osteoporosis and fractures, ask your health care provider if you should:  Be screened for bone loss.  Take a calcium or vitamin D supplement to lower your risk of fractures.  Be given hormone replacement therapy (HRT) to treat symptoms of menopause. Follow these instructions at home: Lifestyle  Do not use any products that contain nicotine or tobacco, such as cigarettes, e-cigarettes, and chewing tobacco. If you need help quitting, ask your health care provider.  Do not use street drugs.  Do not share needles.  Ask your health care provider for help if you need support or information about quitting drugs. Alcohol use  Do not drink alcohol if: ? Your health care provider tells you not to drink. ? You are pregnant, may be pregnant, or are planning to become pregnant.  If you drink alcohol: ? Limit how much you use to 0-1 drink a day. ? Limit intake if you are breastfeeding.  Be aware of how much alcohol is in your drink. In the [place] one drink equals one 12 oz bottle of beer (355 mL), one 5 oz glass of wine (148 mL), or one 1 oz glass of hard liquor (44 mL). General instructions  Schedule regular health, dental, and eye exams.  Stay current with your vaccines.  Tell your health care provider if: ? You often feel depressed. ? You have ever been abused or do not feel safe at home. Summary  Adopting a healthy lifestyle and getting preventive care are important in promoting health and wellness.  Follow your health care provider's instructions about healthy  diet, exercising, and getting tested or screened for diseases.  Follow your health care provider's instructions on monitoring your cholesterol and blood pressure. This information is not intended to replace advice given to you by your health care provider. Make sure you discuss any questions you have with your health care provider. Document Revised: 12/06/2018 Document Reviewed: 12/06/2018 Elsevier Patient Education  2020 Elsevier Inc.  

## 2020-01-23 NOTE — Progress Notes (Signed)
l Subjective:     Linda Robertson is a 49 y.o. female and is here for a comprehensive physical exam. The patient reports problems - see below..  Mood doing well. No problems or concerns.   Pt is concerned with gaining weight and abdominal pain and bloating.   Pt continues to exercise and eat fairly well but gains weight. Last year 145 and this year 168. She admits recently not exercising as much and not eating as well.   For last few months she has had some LUQ pain that is fairly persistent laying on side actually makes worse. She has had a lot of gas and bloating. No vomiting or GERD. Her stools are harder. She tried some "zoo poo" no benefit. Taking calcium. She is dairy free. No melena or hematochezia. No vomiting or reflux issues. Eating later makes worse.   Social History   Socioeconomic History  . Marital status: Divorced    Spouse name: Not on file  . Number of children: 3  . Years of education: Not on file  . Highest education level: Not on file  Occupational History    Comment: Emergency planning/management officer  Tobacco Use  . Smoking status: Never Smoker  . Smokeless tobacco: Never Used  Substance and Sexual Activity  . Alcohol use: Yes    Comment: Social  . Drug use: No  . Sexual activity: Yes  Other Topics Concern  . Not on file  Social History Narrative  . Not on file   Social Determinants of Health   Financial Resource Strain:   . Difficulty of Paying Living Expenses: Not on file  Food Insecurity:   . Worried About Programme researcher, broadcasting/film/video in the Last Year: Not on file  . Ran Out of Food in the Last Year: Not on file  Transportation Needs:   . Lack of Transportation (Medical): Not on file  . Lack of Transportation (Non-Medical): Not on file  Physical Activity:   . Days of Exercise per Week: Not on file  . Minutes of Exercise per Session: Not on file  Stress:   . Feeling of Stress : Not on file  Social Connections:   . Frequency of Communication with Friends and Family: Not  on file  . Frequency of Social Gatherings with Friends and Family: Not on file  . Attends Religious Services: Not on file  . Active Member of Clubs or Organizations: Not on file  . Attends Banker Meetings: Not on file  . Marital Status: Not on file  Intimate Partner Violence:   . Fear of Current or Ex-Partner: Not on file  . Emotionally Abused: Not on file  . Physically Abused: Not on file  . Sexually Abused: Not on file   Health Maintenance  Topic Date Due  . MAMMOGRAM  12/27/2013  . PAP SMEAR-Modifier  06/26/2017  . INFLUENZA VACCINE  03/26/2020 (Originally 07/28/2019)  . HIV Screening  01/22/2021 (Originally 06/20/1985)  . TETANUS/TDAP  07/17/2027    The following portions of the patient's history were reviewed and updated as appropriate: allergies, current medications, past family history, past medical history, past social history, past surgical history and problem list.  Review of Systems Pertinent items noted in HPI and remainder of comprehensive ROS otherwise negative.   Objective:    BP 131/68   Pulse 79   Ht 5\' 6"  (1.676 m)   Wt 168 lb (76.2 kg)   SpO2 96%   BMI 27.12 kg/m  General appearance: alert,  cooperative and appears stated age Head: Normocephalic, without obvious abnormality, atraumatic Eyes: conjunctivae/corneas clear. PERRL, EOM's intact. Fundi benign. Ears: normal TM's and external ear canals both ears Nose: Nares normal. Septum midline. Mucosa normal. No drainage or sinus tenderness. Throat: lips, mucosa, and tongue normal; teeth and gums normal Neck: no adenopathy, no carotid bruit, no JVD, supple, symmetrical, trachea midline and thyroid not enlarged, symmetric, no tenderness/mass/nodules Back: symmetric, no curvature. ROM normal. No CVA tenderness. Lungs: clear to auscultation bilaterally Heart: regular rate and rhythm, S1, S2 normal, no murmur, click, rub or gallop Abdomen: soft, non-tender; bowel sounds normal; no masses,  no  organomegaly Extremities: extremities normal, atraumatic, no cyanosis or edema Pulses: 2+ and symmetric Skin: Skin color, texture, turgor normal. No rashes or lesions Lymph nodes: Cervical, supraclavicular, and axillary nodes normal. Neurologic: Alert and oriented X 3, normal strength and tone. Normal symmetric reflexes. Normal coordination and gait  .Marland Kitchen Depression screen [facility] 2/9 01/23/2020 04/09/2019 09/04/2018 08/09/2017 11/29/2016  Decreased Interest 0 0 0 0 0  Down, Depressed, Hopeless 0 0 0 1 0  PHQ - 2 Score 0 0 0 1 0  Altered sleeping 2 3 1 1 2   Tired, decreased energy 1 0 1 1 1   Change in appetite 2 1 0 0 0  Feeling bad or failure about yourself  0 0 0 0 0  Trouble concentrating 0 0 0 0 0  Moving slowly or fidgety/restless 0 0 0 0 0  Suicidal thoughts 0 0 0 0 0  PHQ-9 Score 5 4 2 3 3   Difficult doing work/chores Not difficult at all Not difficult at all Not difficult at all - -   .Marland Kitchen GAD 7 : Generalized Anxiety Score 01/23/2020 04/09/2019 09/04/2018 11/29/2016  Nervous, Anxious, on Edge 1 0 1 2  Control/stop worrying 0 0 1 1  Worry too much - different things 1 1 1 1   Trouble relaxing 0 0 1 1  Restless 0 0 0 0  Easily annoyed or irritable 1 0 1 0  Afraid - awful might happen 0 0 1 0  Total GAD 7 Score 3 1 6 5   Anxiety Difficulty Not difficult at all Not difficult at all Not difficult at all Not difficult at all     Assessment:    Healthy female exam.      Plan:    Marland KitchenMarland KitchenAmisadai was seen today for annual exam.  Diagnoses and all orders for this visit:  Routine physical examination -     TSH -     Lipid Panel w/reflex Direct LDL -     COMPLETE METABOLIC PANEL WITH GFR -     CBC -     VITAMIN D 25 Hydroxy (Vit-D Deficiency, Fractures)  Panic disorder -     sertraline (ZOLOFT) 50 MG tablet; Take one and one half tablet daily. -     ALPRAZolam (XANAX) 0.5 MG tablet; TAKE ONE TABLET BY MOUTH TWICE A DAY AS NEEDED FOR ANXIETY  Abnormal weight gain -     TSH -      CBC  Post-menopausal -     VITAMIN D 25 Hydroxy (Vit-D Deficiency, Fractures)  Screening for lipid disorders -     Lipid Panel w/reflex Direct LDL  Screening for diabetes mellitus -     COMPLETE METABOLIC PANEL WITH GFR  Medication management -     ABO AND RH   Bloating -     DG Abd 2 Views  Constipation, unspecified constipation type -  DG Abd 2 Views -     H. pylori breath test  Left upper quadrant pain -     DG Abd 2 Views -     H. pylori breath test  Visit for screening mammogram -     MM DIGITAL SCREENING W/ IMPLANTS BILATERAL  Encounter for blood typing -     ABO AND RH    Mood looks good. Refilled medication.   .. Discussed 150 minutes of exercise a week.  Encouraged vitamin D 1000 units and Calcium 1300mg  or 4 servings of dairy a day.  Mammogram ordered.  Needs pap. Declined STD testing.  Declines flu shot.  Fasting labs ordered.   Abdominal xray no acute findings. I think some of abdominal pain could be constipation. Start with miralax bid until stooling better then decrease to once a day then stop. Make sure to stay hydrated. If still giving problems need to go to GI for colonoscopy and evaluation. No acute pain may need to consider abdominal u/s as well.  Follow up in 4 weeks.   .Discussed low carb diet with 1500 calories and 80g of protein.  Exercising at least 150 minutes a week.  My Fitness Pal could be a Marland Kitchen.   See After Visit Summary for Counseling Recommendations

## 2020-01-23 NOTE — Progress Notes (Signed)
Normal gas pattern but stool is present. Start 1 capful of miralax twice a day until stooling regularly then decrease to once a day. Start probiotic daily. Update in 2 weeks.

## 2020-01-24 ENCOUNTER — Other Ambulatory Visit: Payer: Self-pay | Admitting: Neurology

## 2020-01-24 ENCOUNTER — Encounter: Payer: Self-pay | Admitting: Physician Assistant

## 2020-01-24 DIAGNOSIS — E781 Pure hyperglyceridemia: Secondary | ICD-10-CM | POA: Insufficient documentation

## 2020-01-24 DIAGNOSIS — E785 Hyperlipidemia, unspecified: Secondary | ICD-10-CM | POA: Insufficient documentation

## 2020-01-24 DIAGNOSIS — E7800 Pure hypercholesterolemia, unspecified: Secondary | ICD-10-CM

## 2020-01-24 LAB — COMPLETE METABOLIC PANEL WITH GFR
AG Ratio: 1.8 (calc) (ref 1.0–2.5)
ALT: 23 U/L (ref 6–29)
AST: 19 U/L (ref 10–35)
Albumin: 4.8 g/dL (ref 3.6–5.1)
Alkaline phosphatase (APISO): 84 U/L (ref 31–125)
BUN: 24 mg/dL (ref 7–25)
CO2: 24 mmol/L (ref 20–32)
Calcium: 10 mg/dL (ref 8.6–10.2)
Chloride: 100 mmol/L (ref 98–110)
Creat: 0.79 mg/dL (ref 0.50–1.10)
GFR, Est African American: 102 mL/min/{1.73_m2} (ref >=60)
GFR, Est Non African American: 88 mL/min/{1.73_m2} (ref >=60)
Globulin: 2.6 g/dL (calc) (ref 1.9–3.7)
Glucose, Bld: 92 mg/dL (ref 65–99)
Potassium: 4.4 mmol/L (ref 3.5–5.3)
Sodium: 137 mmol/L (ref 135–146)
Total Bilirubin: 0.5 mg/dL (ref 0.2–1.2)
Total Protein: 7.4 g/dL (ref 6.1–8.1)

## 2020-01-24 LAB — LIPID PANEL W/REFLEX DIRECT LDL
Cholesterol: 280 mg/dL — ABNORMAL HIGH (ref <200)
HDL: 52 mg/dL (ref >=50)
LDL Cholesterol (Calc): 163 mg/dL (calc) — ABNORMAL HIGH
Non-HDL Cholesterol (Calc): 228 mg/dL (calc) — ABNORMAL HIGH (ref <130)
Total CHOL/HDL Ratio: 5.4 (calc) — ABNORMAL HIGH (ref <5.0)
Triglycerides: 386 mg/dL — ABNORMAL HIGH (ref <150)

## 2020-01-24 LAB — CBC
HCT: 39.3 % (ref 35.0–45.0)
Hemoglobin: 13.5 g/dL (ref 11.7–15.5)
MCH: 32.2 pg (ref 27.0–33.0)
MCHC: 34.4 g/dL (ref 32.0–36.0)
MCV: 93.8 fL (ref 80.0–100.0)
MPV: 9.6 fL (ref 7.5–12.5)
Platelets: 328 10*3/uL (ref 140–400)
RBC: 4.19 10*6/uL (ref 3.80–5.10)
RDW: 12.1 % (ref 11.0–15.0)
WBC: 8.9 10*3/uL (ref 3.8–10.8)

## 2020-01-24 LAB — TSH: TSH: 1.55 mIU/L

## 2020-01-24 LAB — ABO AND RH

## 2020-01-24 LAB — H. PYLORI BREATH TEST: H. pylori Breath Test: NOT DETECTED

## 2020-01-24 LAB — VITAMIN D 25 HYDROXY (VIT D DEFICIENCY, FRACTURES): Vit D, 25-Hydroxy: 16 ng/mL — ABNORMAL LOW (ref 30–100)

## 2020-01-24 NOTE — Progress Notes (Signed)
Call pt:   Thyroid normal.  Kidney, liver, glucose looks great.  Normal hgb.  Vitamin D is very low. What is your current daily dosing of vitamin D?  Cholesterol went up by 100mg , your TG tripled. You are close to the need medication for cholesterol range but you do not have the other risk factors. I would say take a close look at diet first. Start fish oil 4000mg  daily it can help with TG. Recheck in 6 months.

## 2020-01-25 NOTE — Progress Notes (Signed)
Pt wanted to know blood type and is O+.   H.pylori not detected.

## 2020-07-30 ENCOUNTER — Other Ambulatory Visit: Payer: Self-pay

## 2020-07-30 ENCOUNTER — Emergency Department (HOSPITAL_BASED_OUTPATIENT_CLINIC_OR_DEPARTMENT_OTHER): Payer: 59

## 2020-07-30 ENCOUNTER — Inpatient Hospital Stay (HOSPITAL_BASED_OUTPATIENT_CLINIC_OR_DEPARTMENT_OTHER)
Admission: EM | Admit: 2020-07-30 | Discharge: 2020-08-02 | DRG: 177 | Disposition: A | Discharge status: Home / Self Care | Payer: 59 | Attending: Family Medicine | Admitting: Family Medicine

## 2020-07-30 ENCOUNTER — Encounter (HOSPITAL_BASED_OUTPATIENT_CLINIC_OR_DEPARTMENT_OTHER): Payer: Self-pay | Admitting: Emergency Medicine

## 2020-07-30 DIAGNOSIS — E875 Hyperkalemia: Secondary | ICD-10-CM | POA: Diagnosis not present

## 2020-07-30 DIAGNOSIS — U071 COVID-19: Principal | ICD-10-CM

## 2020-07-30 DIAGNOSIS — J9601 Acute respiratory failure with hypoxia: Secondary | ICD-10-CM | POA: Diagnosis present

## 2020-07-30 DIAGNOSIS — Z79899 Other long term (current) drug therapy: Secondary | ICD-10-CM

## 2020-07-30 DIAGNOSIS — Z885 Allergy status to narcotic agent status: Secondary | ICD-10-CM

## 2020-07-30 DIAGNOSIS — F410 Panic disorder [episodic paroxysmal anxiety] without agoraphobia: Secondary | ICD-10-CM | POA: Diagnosis present

## 2020-07-30 DIAGNOSIS — Z8249 Family history of ischemic heart disease and other diseases of the circulatory system: Secondary | ICD-10-CM

## 2020-07-30 DIAGNOSIS — J1282 Pneumonia due to coronavirus disease 2019: Secondary | ICD-10-CM | POA: Diagnosis present

## 2020-07-30 DIAGNOSIS — F419 Anxiety disorder, unspecified: Secondary | ICD-10-CM | POA: Diagnosis present

## 2020-07-30 LAB — CBC WITH DIFFERENTIAL/PLATELET
Abs Immature Granulocytes: 0.05 10*3/uL (ref 0.00–0.07)
Basophils Absolute: 0 10*3/uL (ref 0.0–0.1)
Basophils Relative: 0 %
Eosinophils Absolute: 0 10*3/uL (ref 0.0–0.5)
Eosinophils Relative: 0 %
HCT: 38.8 % (ref 36.0–46.0)
Hemoglobin: 13.1 g/dL (ref 12.0–15.0)
Immature Granulocytes: 1 %
Lymphocytes Relative: 20 %
Lymphs Abs: 1.8 10*3/uL (ref 0.7–4.0)
MCH: 32.2 pg (ref 26.0–34.0)
MCHC: 33.8 g/dL (ref 30.0–36.0)
MCV: 95.3 fL (ref 80.0–100.0)
Monocytes Absolute: 0.4 10*3/uL (ref 0.1–1.0)
Monocytes Relative: 4 %
Neutro Abs: 6.6 10*3/uL (ref 1.7–7.7)
Neutrophils Relative %: 75 %
Platelets: 256 10*3/uL (ref 150–400)
RBC: 4.07 MIL/uL (ref 3.87–5.11)
RDW: 12.5 % (ref 11.5–15.5)
WBC: 8.8 10*3/uL (ref 4.0–10.5)
nRBC: 0 % (ref 0.0–0.2)

## 2020-07-30 LAB — COMPREHENSIVE METABOLIC PANEL
ALT: 63 U/L — ABNORMAL HIGH (ref 0–44)
AST: 98 U/L — ABNORMAL HIGH (ref 15–41)
Albumin: 3.8 g/dL (ref 3.5–5.0)
Alkaline Phosphatase: 64 U/L (ref 38–126)
Anion gap: 17 — ABNORMAL HIGH (ref 5–15)
BUN: 12 mg/dL (ref 6–20)
CO2: 23 mmol/L (ref 22–32)
Calcium: 8.6 mg/dL — ABNORMAL LOW (ref 8.9–10.3)
Chloride: 98 mmol/L (ref 98–111)
Creatinine, Ser: 0.77 mg/dL (ref 0.44–1.00)
GFR calc Af Amer: >60 mL/min (ref >60)
GFR calc non Af Amer: >60 mL/min (ref >60)
Glucose, Bld: 106 mg/dL — ABNORMAL HIGH (ref 70–99)
Potassium: 3.9 mmol/L (ref 3.5–5.1)
Sodium: 138 mmol/L (ref 135–145)
Total Bilirubin: 0.4 mg/dL (ref 0.3–1.2)
Total Protein: 8.2 g/dL — ABNORMAL HIGH (ref 6.5–8.1)

## 2020-07-30 LAB — C-REACTIVE PROTEIN: CRP: 17.7 mg/dL — ABNORMAL HIGH (ref <1.0)

## 2020-07-30 LAB — D-DIMER, QUANTITATIVE: D-Dimer, Quant: 0.82 ug/mL-FEU — ABNORMAL HIGH (ref 0.00–0.50)

## 2020-07-30 LAB — SARS CORONAVIRUS 2 BY RT PCR (HOSPITAL ORDER, PERFORMED IN ~~LOC~~ HOSPITAL LAB): SARS Coronavirus 2: POSITIVE — AB

## 2020-07-30 LAB — FIBRINOGEN: Fibrinogen: 674 mg/dL — ABNORMAL HIGH (ref 210–475)

## 2020-07-30 LAB — FERRITIN: Ferritin: 1305 ng/mL — ABNORMAL HIGH (ref 11–307)

## 2020-07-30 LAB — PROCALCITONIN: Procalcitonin: 0.72 ng/mL

## 2020-07-30 MED ORDER — SODIUM CHLORIDE 0.9 % IV SOLN
100.0000 mg | INTRAVENOUS | Status: AC
  Administered 2020-07-30 (×2): 100 mg via INTRAVENOUS
  Filled 2020-07-30 (×2): qty 20

## 2020-07-30 MED ORDER — DEXAMETHASONE SODIUM PHOSPHATE 10 MG/ML IJ SOLN
10.0000 mg | Freq: Once | INTRAMUSCULAR | Status: AC
  Administered 2020-07-30: 10 mg via INTRAVENOUS
  Filled 2020-07-30: qty 1

## 2020-07-30 MED ORDER — SODIUM CHLORIDE 0.9 % IV SOLN
INTRAVENOUS | Status: DC | PRN
  Administered 2020-07-30: 500 mL via INTRAVENOUS

## 2020-07-30 MED ORDER — ACETAMINOPHEN 325 MG PO TABS
650.0000 mg | ORAL_TABLET | Freq: Once | ORAL | Status: AC
  Administered 2020-07-30: 650 mg via ORAL
  Filled 2020-07-30: qty 2

## 2020-07-30 MED ORDER — SODIUM CHLORIDE 0.9 % IV SOLN
100.0000 mg | Freq: Every day | INTRAVENOUS | Status: DC
  Administered 2020-07-31 – 2020-08-02 (×3): 100 mg via INTRAVENOUS
  Filled 2020-07-30 (×3): qty 20

## 2020-07-30 NOTE — ED Provider Notes (Signed)
[facility] EMERGENCY DEPARTMENT Provider Note  CSN: [id number] Arrival date & time: 07/30/20 1124    History Chief Complaint  Patient presents with  . Shortness of Breath    HPI  Linda Robertson is a 49 y.o. female with no significant PMH reports she has not felt well for the last 5-6 days. Was seen at [facility] on 8/1 and had positive Covid test. Was ultimately discharged home. She has had continued cough, SOB, general weakness and chest soreness since then. This morning she felt like she was having trouble catching her breath and so she called EMS. Per their report, Fire on scene first found her SpO2 to be 88% and started her on NRB with improvement.    Past Medical History:  Diagnosis Date  . Anxiety     Past Surgical History:  Procedure Laterality Date  . APPENDECTOMY    . BREAST ENHANCEMENT SURGERY      Family History  Problem Relation Age of Onset  . Heart attack Father   . Sudden death Father 59  . Heart failure Father   . Heart attack Maternal Grandmother   . Congestive Heart Failure Maternal Grandmother   . Atrial fibrillation Mother   . Heart attack Maternal Grandfather   . Heart attack Paternal Grandmother   . Diabetes Neg Hx   . Hyperlipidemia Neg Hx     Social History   Tobacco Use  . Smoking status: Never Smoker  . Smokeless tobacco: Never Used  Substance Use Topics  . Alcohol use: Yes    Comment: Social  . Drug use: No     Home Medications Prior to Admission medications   Medication Sig Start Date End Date Taking? Authorizing Provider  ALPRAZolam Prudy Feeler) 0.5 MG tablet TAKE ONE TABLET BY MOUTH TWICE A DAY AS NEEDED FOR ANXIETY 01/23/20   Breeback, Jade L, PA-C  sertraline (ZOLOFT) 50 MG tablet Take one and one half tablet daily. 01/23/20   Jomarie Longs, PA-C     Allergies    Codeine   Review of Systems   Review of Systems A comprehensive review of systems was completed and negative except as noted in HPI.    Physical  Exam BP 115/68   Pulse 64   Temp (!) 101.4 F (38.6 C) (Oral)   Resp (!) 26   Ht 5\' 7"  (1.702 m)   Wt 65.8 kg   SpO2 96%   BMI 22.71 kg/m   Physical Exam Vitals and nursing note reviewed.  Constitutional:      Appearance: Normal appearance.  HENT:     Head: Normocephalic and atraumatic.     Nose: Nose normal.     Mouth/Throat:     Mouth: Mucous membranes are moist.  Eyes:     Extraocular Movements: Extraocular movements intact.     Conjunctiva/sclera: Conjunctivae normal.  Cardiovascular:     Rate and Rhythm: Normal rate.  Pulmonary:     Effort: Pulmonary effort is normal.     Breath sounds: Rales present.  Abdominal:     General: Abdomen is flat.     Palpations: Abdomen is soft.     Tenderness: There is no abdominal tenderness.  Musculoskeletal:        General: No swelling. Normal range of motion.     Cervical back: Neck supple.  Skin:    General: Skin is warm and dry.  Neurological:     General: No focal deficit present.     Mental Status:  She is alert.  Psychiatric:        Mood and Affect: Mood normal.      ED Results / Procedures / Treatments   Labs (all labs ordered are listed, but only abnormal results are displayed) Labs Reviewed  COMPREHENSIVE METABOLIC PANEL - Abnormal; Notable for the following components:      Result Value   Glucose, Bld 106 (*)    Calcium 8.6 (*)    Total Protein 8.2 (*)    AST 98 (*)    ALT 63 (*)    Anion gap 17 (*)    All other components within normal limits  SARS CORONAVIRUS 2 BY RT PCR (HOSPITAL ORDER, PERFORMED IN [facility])  CBC WITH DIFFERENTIAL/PLATELET  D-DIMER, QUANTITATIVE (NOT AT [facility])  PROCALCITONIN  FERRITIN  FIBRINOGEN  C-REACTIVE PROTEIN  PREGNANCY, URINE    EKG None   Radiology DG Chest Port 1 View  Result Date: 07/30/2020 CLINICAL DATA:  c/o SHOB, Covid+, sx since Friday EXAM: PORTABLE CHEST 1 VIEW COMPARISON:  Chest radiograph 07/14/2017 FINDINGS: The heart size and mediastinal  contours are within normal limits. The lungs are clear. No pneumothorax or significant pleural effusion. The visualized skeletal structures are unremarkable. IMPRESSION: No acute cardiopulmonary process. Electronically Signed   By: Emmaline Kluver M.D.   On: 07/30/2020 12:23    Procedures Procedures  Medications Ordered in the ED Medications  remdesivir 100 mg in sodium chloride 0.9 % 100 mL IVPB ( Intravenous Stopped 07/30/20 1502)  remdesivir 100 mg in sodium chloride 0.9 % 100 mL IVPB (has no administration in time range)  0.9 %  sodium chloride infusion (500 mLs Intravenous New Bag/Given 07/30/20 1426)  acetaminophen (TYLENOL) tablet 650 mg (650 mg Oral Given 07/30/20 1248)  dexamethasone (DECADRON) injection 10 mg (10 mg Intravenous Given 07/30/20 1426)     MDM Rules/Calculators/A&P MDM Patient confirmed Covid from [facility] 3 days ago. Oxygen titrated down to 2L on arrival. Will check CBC, CMP and CXR, room air sat.  ED Course  I have reviewed the triage vital signs and the nursing notes.  Pertinent labs & imaging results that were available during my care of the patient were reviewed by me and considered in my medical decision making (see chart for details).  Clinical Course as of Jul 30 1517  Wed Jul 30, 2020  1231 CXR clear   [CS]  1310 Patient now running a fever. APAP ordered. CBC is normal.    [CS]  1318 CMP unremarkable.    [CS]  1326 Patient drops SpO2 to 91% with brief ambulation, became dizzy and had to sit down. Will discuss most appropriate management with Hospitalist on call.    [CS]  1346 Spoke with Dr. Osvaldo Shipper who will admit, recommends initiated Remdesivir and Decadron. Patient aware of plan. Covid test from [facility] not visible in CareEverywhere, a repeat has been ordered here.    [CS]    Clinical Course User Index [CS] Pollyann Savoy, MD    Final Clinical Impression(s) / ED Diagnoses Final diagnoses:  COVID-19    Rx / DC Orders ED Discharge Orders     None       Pollyann Savoy, MD 07/30/20 [phone]

## 2020-07-30 NOTE — ED Triage Notes (Signed)
Pt to ED via [facility] with c/o SHOB, Covid+, sx since Friday

## 2020-07-30 NOTE — ED Notes (Addendum)
Patient ambulated in room. SAT dropped from 96% on 90%. Patient stated she was dizzy, and so we sat back down. More SOB when walking. MD and RN aware. SAT returned to 93% after sitting on RA

## 2020-07-30 NOTE — ED Notes (Signed)
O2 dc'd by EDP Bernette Mayers)

## 2020-07-31 ENCOUNTER — Emergency Department (HOSPITAL_BASED_OUTPATIENT_CLINIC_OR_DEPARTMENT_OTHER): Payer: 59

## 2020-07-31 ENCOUNTER — Encounter (HOSPITAL_COMMUNITY): Payer: Self-pay | Admitting: Internal Medicine

## 2020-07-31 DIAGNOSIS — U071 COVID-19: Secondary | ICD-10-CM | POA: Diagnosis present

## 2020-07-31 DIAGNOSIS — F419 Anxiety disorder, unspecified: Secondary | ICD-10-CM | POA: Diagnosis present

## 2020-07-31 DIAGNOSIS — J1282 Pneumonia due to coronavirus disease 2019: Secondary | ICD-10-CM | POA: Diagnosis present

## 2020-07-31 DIAGNOSIS — Z79899 Other long term (current) drug therapy: Secondary | ICD-10-CM | POA: Diagnosis not present

## 2020-07-31 DIAGNOSIS — J9600 Acute respiratory failure, unspecified whether with hypoxia or hypercapnia: Secondary | ICD-10-CM

## 2020-07-31 DIAGNOSIS — E875 Hyperkalemia: Secondary | ICD-10-CM | POA: Diagnosis not present

## 2020-07-31 DIAGNOSIS — Z8249 Family history of ischemic heart disease and other diseases of the circulatory system: Secondary | ICD-10-CM | POA: Diagnosis not present

## 2020-07-31 DIAGNOSIS — J9601 Acute respiratory failure with hypoxia: Secondary | ICD-10-CM | POA: Diagnosis present

## 2020-07-31 DIAGNOSIS — Z885 Allergy status to narcotic agent status: Secondary | ICD-10-CM | POA: Diagnosis not present

## 2020-07-31 DIAGNOSIS — F410 Panic disorder [episodic paroxysmal anxiety] without agoraphobia: Secondary | ICD-10-CM | POA: Diagnosis present

## 2020-07-31 LAB — COMPREHENSIVE METABOLIC PANEL
ALT: 71 U/L — ABNORMAL HIGH (ref 0–44)
AST: 75 U/L — ABNORMAL HIGH (ref 15–41)
Albumin: 4.3 g/dL (ref 3.5–5.0)
Alkaline Phosphatase: 57 U/L (ref 38–126)
Anion gap: 12 (ref 5–15)
BUN: 17 mg/dL (ref 6–20)
CO2: 24 mmol/L (ref 22–32)
Calcium: 9.8 mg/dL (ref 8.9–10.3)
Chloride: 102 mmol/L (ref 98–111)
Creatinine, Ser: 0.66 mg/dL (ref 0.44–1.00)
GFR calc Af Amer: >60 mL/min (ref >60)
GFR calc non Af Amer: >60 mL/min (ref >60)
Glucose, Bld: 96 mg/dL (ref 70–99)
Potassium: 4 mmol/L (ref 3.5–5.1)
Sodium: 138 mmol/L (ref 135–145)
Total Bilirubin: 0.4 mg/dL (ref 0.3–1.2)
Total Protein: 7.7 g/dL (ref 6.5–8.1)

## 2020-07-31 LAB — CBC WITH DIFFERENTIAL/PLATELET
Abs Immature Granulocytes: 0.04 10*3/uL (ref 0.00–0.07)
Basophils Absolute: 0 10*3/uL (ref 0.0–0.1)
Basophils Relative: 0 %
Eosinophils Absolute: 0 10*3/uL (ref 0.0–0.5)
Eosinophils Relative: 0 %
HCT: 39.2 % (ref 36.0–46.0)
Hemoglobin: 13.3 g/dL (ref 12.0–15.0)
Immature Granulocytes: 1 %
Lymphocytes Relative: 32 %
Lymphs Abs: 1.6 10*3/uL (ref 0.7–4.0)
MCH: 31.7 pg (ref 26.0–34.0)
MCHC: 33.9 g/dL (ref 30.0–36.0)
MCV: 93.6 fL (ref 80.0–100.0)
Monocytes Absolute: 0.6 10*3/uL (ref 0.1–1.0)
Monocytes Relative: 11 %
Neutro Abs: 2.8 10*3/uL (ref 1.7–7.7)
Neutrophils Relative %: 56 %
Platelets: 294 10*3/uL (ref 150–400)
RBC: 4.19 MIL/uL (ref 3.87–5.11)
RDW: 12.5 % (ref 11.5–15.5)
Smear Review: NORMAL
WBC: 4.9 10*3/uL (ref 4.0–10.5)
nRBC: 0 % (ref 0.0–0.2)

## 2020-07-31 LAB — C-REACTIVE PROTEIN: CRP: 16 mg/dL — ABNORMAL HIGH (ref <1.0)

## 2020-07-31 LAB — D-DIMER, QUANTITATIVE: D-Dimer, Quant: 0.74 ug/mL-FEU — ABNORMAL HIGH (ref 0.00–0.50)

## 2020-07-31 MED ORDER — SERTRALINE HCL 50 MG PO TABS
50.0000 mg | ORAL_TABLET | Freq: Every day | ORAL | Status: DC
  Administered 2020-08-01 – 2020-08-02 (×2): 50 mg via ORAL
  Filled 2020-07-31 (×2): qty 1

## 2020-07-31 MED ORDER — ENOXAPARIN SODIUM 40 MG/0.4ML ~~LOC~~ SOLN
40.0000 mg | SUBCUTANEOUS | Status: DC
  Administered 2020-08-01: 40 mg via SUBCUTANEOUS
  Filled 2020-07-31: qty 0.4

## 2020-07-31 MED ORDER — ALPRAZOLAM 0.5 MG PO TABS
0.5000 mg | ORAL_TABLET | Freq: Two times a day (BID) | ORAL | Status: DC | PRN
  Administered 2020-08-01: 0.5 mg via ORAL
  Filled 2020-07-31: qty 1

## 2020-07-31 MED ORDER — METHYLPREDNISOLONE SODIUM SUCC 40 MG IJ SOLR
40.0000 mg | Freq: Two times a day (BID) | INTRAMUSCULAR | Status: DC
  Administered 2020-07-31 – 2020-08-02 (×5): 40 mg via INTRAVENOUS
  Filled 2020-07-31 (×5): qty 1

## 2020-07-31 MED ORDER — ONDANSETRON HCL 4 MG PO TABS: 4.0000 mg | ORAL_TABLET | Freq: Four times a day (QID) | ORAL | Status: DC | PRN

## 2020-07-31 MED ORDER — ONDANSETRON HCL 4 MG/2ML IJ SOLN: 4.0000 mg | Freq: Four times a day (QID) | INTRAMUSCULAR | Status: DC | PRN

## 2020-07-31 NOTE — ED Notes (Signed)
Pt had pulled leads off they were reconnected

## 2020-07-31 NOTE — Progress Notes (Signed)
Pt arrived via care link transfer on 1L 02. Pt oriented to unit does not appear to be in distress. Vss wheels locked bed in lowest position. On call provider notified of tx. Will continue to monitor.

## 2020-07-31 NOTE — ED Notes (Signed)
Placed patient on 1.5L [place] w/humidity. Patient oxygen saturations are 94-96%. Patient on monitor. Patient tolerated well.

## 2020-07-31 NOTE — ED Notes (Signed)
Attempted to call report to floor, left name/number for call back with Misty Stanley

## 2020-07-31 NOTE — ED Provider Notes (Signed)
Patient is boarding in the emergency department, awaiting an inpatient bed.  I discussed with Dr. Rito Ehrlich, who asked for CBC, CMP, CRP, and D-dimer. Solumedrol 40 mg BID. Also asks for repeat CXR.   Pricilla Loveless, MD 07/31/20 [phone]

## 2020-07-31 NOTE — H&P (Signed)
History and Physical    Linda Robertson WGY:[id number] DOB: 08-Aug-1971 DOA: 07/30/2020  PCP: Jomarie Longs, PA-C  Patient coming from: Home.  Chief Complaint: Shortness of breath.  HPI: Linda Robertson is a 49 y.o. female with history of panic attacks presents to the ER because of shortness of breath.  Patient was diagnosed with COVID-19 infection on July 27, 2020 that is about 6 days ago.  At that time patient's primary symptom was shortness of breath headache fatigue.  Also had some diarrhea.  Since the diagnosis patient has become progressively short of breath with exertion.  Denies chest pain.  Diarrhea is improving.  ED Course: Patient came to the ER on July 27, 2020 chest x-ray initially did not show anything acute repeat one showed some pneumonitis.  Patient was requiring 2 L oxygen to maintain sats.  Lab work show ferritin of 1300 CRP initially was 17.7.  Procalcitonin 0.7 D-dimer 1.8 fibrinogen 674.  Patient was started on remdesivir and IV Solu-Medrol.  Review of Systems: As per HPI, rest all negative.   Past Medical History:  Diagnosis Date  . Anxiety     Past Surgical History:  Procedure Laterality Date  . APPENDECTOMY    . BREAST ENHANCEMENT SURGERY       reports that she has never smoked. She has never used smokeless tobacco. She reports current alcohol use. She reports that she does not use drugs.  Allergies  Allergen Reactions  . Codeine Itching    Family History  Problem Relation Age of Onset  . Heart attack Father   . Sudden death Father 28  . Heart failure Father   . Heart attack Maternal Grandmother   . Congestive Heart Failure Maternal Grandmother   . Atrial fibrillation Mother   . Heart attack Maternal Grandfather   . Heart attack Paternal Grandmother   . Diabetes Neg Hx   . Hyperlipidemia Neg Hx     Prior to Admission medications   Medication Sig Start Date End Date Taking? Authorizing Provider  ALPRAZolam (XANAX) 0.5 MG tablet TAKE ONE  TABLET BY MOUTH TWICE A DAY AS NEEDED FOR ANXIETY Patient taking differently: Take 0.5 mg by mouth 2 (two) times daily as needed for anxiety.  01/23/20  Yes Breeback, Jade L, PA-C  sertraline (ZOLOFT) 50 MG tablet Take one and one half tablet daily. Patient taking differently: Take 50 mg by mouth daily.  01/23/20  Yes Breeback, Lonna Cobb, PA-C    Physical Exam: Constitutional: Moderately built and nourished. Vitals:   07/31/20 2000 07/31/20 2102 07/31/20 2130 07/31/20 2248  BP: (!) 109/52 (!) 120/58 126/70 138/70  Pulse: (!) 53 61 61 (!) 59  Resp: 18 18 19 20   Temp:    98.4 F (36.9 C)  TempSrc:    Oral  SpO2: 92% 98% 98% 99%  Weight:      Height:       Eyes: Anicteric no pallor. ENMT: No discharge from the ears eyes nose or mouth. Neck: No mass felt.  No neck rigidity. Respiratory: No rhonchi or crepitations. Cardiovascular: S1-S2 heard. Abdomen: Soft nontender bowel sounds present. Musculoskeletal: No edema.  No effusion. Skin: No rash. Neurologic: Alert awake oriented to time place and person.  Moves all extremities. Psychiatric: Appears normal.  Normal affect.   Labs on Admission: I have personally reviewed following labs and imaging studies  CBC: Recent Labs  Lab 07/30/20 1241 07/31/20 0814  WBC 8.8 4.9  NEUTROABS 6.6 2.8  HGB 13.1  13.3  HCT 38.8 39.2  MCV 95.3 93.6  PLT 256 294   Basic Metabolic Panel: Recent Labs  Lab 07/30/20 1241 07/31/20 0814  NA 138 138  K 3.9 4.0  CL 98 102  CO2 23 24  GLUCOSE 106* 96  BUN 12 17  CREATININE 0.77 0.66  CALCIUM 8.6* 9.8   GFR: Estimated Creatinine Clearance: 81.8 mL/min (by C-G formula based on SCr of 0.66 mg/dL). Liver Function Tests: Recent Labs  Lab 07/30/20 1241 07/31/20 0814  AST 98* 75*  ALT 63* 71*  ALKPHOS 64 57  BILITOT 0.4 0.4  PROT 8.2* 7.7  ALBUMIN 3.8 4.3   No results for input(s): LIPASE, AMYLASE in the last 168 hours. No results for input(s): AMMONIA in the last 168 hours. Coagulation  Profile: No results for input(s): INR, PROTIME in the last 168 hours. Cardiac Enzymes: No results for input(s): CKTOTAL, CKMB, CKMBINDEX, TROPONINI in the last 168 hours. BNP (last 3 results) No results for input(s): PROBNP in the last 8760 hours. HbA1C: No results for input(s): HGBA1C in the last 72 hours. CBG: No results for input(s): GLUCAP in the last 168 hours. Lipid Profile: No results for input(s): CHOL, HDL, LDLCALC, TRIG, CHOLHDL, LDLDIRECT in the last 72 hours. Thyroid Function Tests: No results for input(s): TSH, T4TOTAL, FREET4, T3FREE, THYROIDAB in the last 72 hours. Anemia Panel: Recent Labs    07/30/20 1558  FERRITIN 1,305*   Urine analysis:    Component Value Date/Time   COLORURINE YELLOW 02/06/2011 0643   APPEARANCEUR CLEAR 02/06/2011 0643   LABSPEC 1.011 02/06/2011 0643   PHURINE 5.5 02/06/2011 0643   HGBUR NEGATIVE 02/06/2011 0643   BILIRUBINUR NEGATIVE 02/06/2011 0643   KETONESUR NEGATIVE 02/06/2011 0643   PROTEINUR NEGATIVE 02/06/2011 0643   UROBILINOGEN 0.2 02/06/2011 0643   NITRITE NEGATIVE 02/06/2011 0643   LEUKOCYTESUR  02/06/2011 0643    NEGATIVE MICROSCOPIC NOT DONE ON URINES WITH NEGATIVE PROTEIN, BLOOD, LEUKOCYTES, NITRITE, OR GLUCOSE <1000 mg/dL.   Sepsis Labs: @LABRCNTIP (procalcitonin:4,lacticidven:4) ) Recent Results (from the past 240 hour(s))  SARS Coronavirus 2 by RT PCR (hospital order, performed in [facility] hospital lab) Nasopharyngeal Nasopharyngeal Swab     Status: Abnormal   Collection Time: 07/30/20  2:41 PM   Specimen: Nasopharyngeal Swab  Result Value Ref Range Status   SARS Coronavirus 2 POSITIVE (A) NEGATIVE Final    Comment: RESULT CALLED TO, READ BACK BY AND VERIFIED WITH:  CALLED TO MARVA SIMMS AT 1545 BY SROY ON 09/29/20 (NOTE) SARS-CoV-2 target nucleic acids are DETECTED  SARS-CoV-2 RNA is generally detectable in upper respiratory specimens  during the acute phase of infection.  Positive results are indicative  of  the presence of the identified virus, but do not rule out bacterial infection or co-infection with other pathogens not detected by the test.  Clinical correlation with patient history and  other diagnostic information is necessary to determine patient infection status.  The expected result is negative.  Fact Sheet for Patients:   409811   Fact Sheet for Healthcare Providers:   BoilerBrush.com.cy    This test is not yet approved or cleared by the https://pope.com/ FDA and  has been authorized for detection and/or diagnosis of SARS-CoV-2 by FDA under an Emergency Use Authorization (EUA).  This EUA will remain in effect (m eaning this test can be used) for the duration of  the COVID-19 declaration under Section 564(b)(1) of the Act, 21 U.S.C. section 360-bbb-3(b)(1), unless the authorization is terminated or revoked sooner.  Performed at [facility], [address] [place], [place] [address]      Radiological Exams on Admission: DG Chest Portable 1 View  Result Date: 07/31/2020 CLINICAL DATA:  Shortness of breath.  COVID. EXAM: PORTABLE CHEST 1 VIEW COMPARISON:  07/30/2020. FINDINGS: Mediastinum hilar structures normal. Heart size normal. Low lung volumes. Mild bilateral interstitial prominence noted suggesting pneumonitis. No pleural effusion or pneumothorax. IMPRESSION: Low lung volumes. Mild bilateral interstitial prominence noted consistent with pneumonitis. Electronically Signed   By: Maisie Fus  Register   On: 07/31/2020 08:43   DG Chest Port 1 View  Result Date: 07/30/2020 CLINICAL DATA:  c/o SHOB, Covid+, sx since Friday EXAM: PORTABLE CHEST 1 VIEW COMPARISON:  Chest radiograph 07/14/2017 FINDINGS: The heart size and mediastinal contours are within normal limits. The lungs are clear. No pneumothorax or significant pleural effusion. The visualized skeletal structures are unremarkable. IMPRESSION: No acute cardiopulmonary  process. Electronically Signed   By: Emmaline Kluver M.D.   On: 07/30/2020 12:23     Assessment/Plan Principal Problem:   Acute respiratory failure due to COVID-19 (HCC)    1. Acute respiratory failure with hypoxia secondary to Covid pneumonia for which patient has been started on IV steroids and remdesivir.  Patient has given consent for Actemra if required after discussing with patient about his contraindication side effect.  Closely follow respiratory status and pulmonary marks. 2. History of panic attacks on Zoloft and Xanax as needed.  Since patient has acute respiratory failure with Covid infection will need close monitoring for any further deterioration in inpatient status.   DVT prophylaxis: Lovenox. Code Status: Full code. Family Communication: Discussed with patient. Disposition Plan: Home. Consults called: None. Admission status: Inpatient.   Eduard Clos MD [facility] Pager [phone].  If 7PM-7AM, please contact night-coverage [email or web address] Password [facility]  07/31/2020, 11:57 PM

## 2020-07-31 NOTE — ED Notes (Signed)
Pt up to use bathroom had small amt incontinent stool . Pt linen changed and pt cleaned up.  , pt sob with exertion

## 2020-07-31 NOTE — ED Notes (Signed)
Pt requested and given mac and cheese

## 2020-08-01 LAB — CBC WITH DIFFERENTIAL/PLATELET
Abs Immature Granulocytes: 0.06 10*3/uL (ref 0.00–0.07)
Basophils Absolute: 0 10*3/uL (ref 0.0–0.1)
Basophils Relative: 0 %
Eosinophils Absolute: 0 10*3/uL (ref 0.0–0.5)
Eosinophils Relative: 0 %
HCT: 39.3 % (ref 36.0–46.0)
Hemoglobin: 13.3 g/dL (ref 12.0–15.0)
Immature Granulocytes: 1 %
Lymphocytes Relative: 20 %
Lymphs Abs: 1.4 10*3/uL (ref 0.7–4.0)
MCH: 31.7 pg (ref 26.0–34.0)
MCHC: 33.8 g/dL (ref 30.0–36.0)
MCV: 93.8 fL (ref 80.0–100.0)
Monocytes Absolute: 0.8 10*3/uL (ref 0.1–1.0)
Monocytes Relative: 11 %
Neutro Abs: 4.7 10*3/uL (ref 1.7–7.7)
Neutrophils Relative %: 68 %
Platelets: 353 10*3/uL (ref 150–400)
RBC: 4.19 MIL/uL (ref 3.87–5.11)
RDW: 12.4 % (ref 11.5–15.5)
WBC: 6.8 10*3/uL (ref 4.0–10.5)
nRBC: 0 % (ref 0.0–0.2)

## 2020-08-01 LAB — COMPREHENSIVE METABOLIC PANEL
ALT: 77 U/L — ABNORMAL HIGH (ref 0–44)
AST: 65 U/L — ABNORMAL HIGH (ref 15–41)
Albumin: 3.8 g/dL (ref 3.5–5.0)
Alkaline Phosphatase: 57 U/L (ref 38–126)
Anion gap: 12 (ref 5–15)
BUN: 20 mg/dL (ref 6–20)
CO2: 21 mmol/L — ABNORMAL LOW (ref 22–32)
Calcium: 9.1 mg/dL (ref 8.9–10.3)
Chloride: 104 mmol/L (ref 98–111)
Creatinine, Ser: 0.75 mg/dL (ref 0.44–1.00)
GFR calc Af Amer: >60 mL/min (ref >60)
GFR calc non Af Amer: >60 mL/min (ref >60)
Glucose, Bld: 123 mg/dL — ABNORMAL HIGH (ref 70–99)
Potassium: 4.1 mmol/L (ref 3.5–5.1)
Sodium: 137 mmol/L (ref 135–145)
Total Bilirubin: 0.5 mg/dL (ref 0.3–1.2)
Total Protein: 7.7 g/dL (ref 6.5–8.1)

## 2020-08-01 LAB — HIV ANTIBODY (ROUTINE TESTING W REFLEX): HIV Screen 4th Generation wRfx: NONREACTIVE

## 2020-08-01 LAB — D-DIMER, QUANTITATIVE: D-Dimer, Quant: 0.64 ug/mL-FEU — ABNORMAL HIGH (ref 0.00–0.50)

## 2020-08-01 LAB — PROCALCITONIN: Procalcitonin: 0.25 ng/mL

## 2020-08-01 LAB — TROPONIN I (HIGH SENSITIVITY): Troponin I (High Sensitivity): 4 ng/L (ref <18)

## 2020-08-01 LAB — C-REACTIVE PROTEIN: CRP: 10.4 mg/dL — ABNORMAL HIGH (ref <1.0)

## 2020-08-01 LAB — ABO/RH: ABO/RH(D): O POS

## 2020-08-01 MED ORDER — ACETAMINOPHEN 500 MG PO TABS
1000.0000 mg | ORAL_TABLET | Freq: Once | ORAL | Status: AC
  Administered 2020-08-01: 1000 mg via ORAL
  Filled 2020-08-01: qty 2

## 2020-08-01 MED ORDER — PHENOL 1.4 % MT LIQD
1.0000 | OROMUCOSAL | Status: DC | PRN
  Administered 2020-08-01: 1 via OROMUCOSAL
  Filled 2020-08-01: qty 177

## 2020-08-01 MED ORDER — GUAIFENESIN-DM 100-10 MG/5ML PO SYRP
5.0000 mL | ORAL_SOLUTION | ORAL | Status: DC | PRN
  Administered 2020-08-01: 5 mL via ORAL
  Filled 2020-08-01: qty 10

## 2020-08-01 NOTE — Progress Notes (Signed)
PROGRESS NOTE  Linda Robertson  VQX:[id number] DOB: Aug 18, 1971 DOA: 07/30/2020 PCP: Jomarie Longs, PA-C   Brief Narrative: Linda Robertson is a 49 y.o. female with a history of panic disorder who presented to the ED 8/4 with progressive shortness of breath in the setting of fatigue, HA, diarrhea and dyspnea having been diagnosed with covid-19 8/1 at [facility]. She was hypoxemic on EMS arrival and in the ED requiring 2L O2 with CRP 17.7, PCT 0.72, d-dimer 0.82, and reassuring CXR. Remdesivir and steroids started, admission requested. Boarded in ED with follow up CXR demonstrating bilateral interstitial prominence, ultimately admitted to [facility] 8/5. CRP has trended downward and hypoxia at rest has resolved, though she remains dyspneic with exertional hypoxia.   Assessment & Plan: Principal Problem:   Acute respiratory failure due to COVID-19 [facility])  Acute hypoxemic respiratory failure due to covid-19 pneumonia: SARS-CoV-2 testing positive 8/1, confirmed PCR positive on 8/4. - Continue remdesivir x5 days (8/4 - 8/8) - Continue steroids x10 days (8/4 - 8/13) - Despite significant CRP elevation (17.7) at admission, mild and improving hypoxemia do not indicate use of tocilizumab. - Encourage OOB, IS, FV, and awake proning if able - Continue airborne, contact precautions for 21 days from positive testing. - Monitor CMP and inflammatory markers - Enoxaparin prophylactic dose.    Panic disorder, anxiety:  - Continue home medications. Seems to be reasonably well-controlled, though would be expected to exacerbate (and be exacerbated by) symptoms.  DVT prophylaxis: Lovenox Code Status: Full Family Communication: None at bedside Disposition Plan:  Status is: Inpatient  Remains inpatient appropriate because: Inpatient level of care appropriate due to severity of illness  Dispo: The patient is from: Home              Anticipated d/c is to: Home              Anticipated d/c date is: 1 day               Patient currently is not medically stable to d/c.  Consultants:   None  Procedures:   None  Antimicrobials:  Remdesivir   Subjective: Shortness of breath with exertion is moderate, worse without oxygen, though overall much improved from admission. No chest pain.   Objective: Vitals:   08/01/20 1213 08/01/20 1441 08/01/20 1530 08/01/20 1535  BP:  (!) 116/48    Pulse:  (!) 53    Resp:  16    Temp:  99.6 F (37.6 C)    TempSrc:  Oral    SpO2: 96% 91% 94% (!) 88%  Weight:      Height:        Intake/Output Summary (Last 24 hours) at 08/01/2020 1601 Last data filed at 08/01/2020 1508 Gross per 24 hour  Intake 644.62 ml  Output --  Net 644.62 ml   Filed Weights   07/30/20 1150  Weight: 65.8 kg    Gen: 49 y.o. female in no distress  Pulm: Non-labored breathing on 1.5L O2, minimal crackles bilaterally.  CV: Regular rate and rhythm. No murmur, rub, or gallop. No JVD, no pedal edema. GI: Abdomen soft, non-tender, non-distended, with normoactive bowel sounds. No organomegaly or masses felt. Ext: Warm, no deformities Skin: No rashes, lesions or ulcers Neuro: Alert and oriented. No focal neurological deficits. Psych: Judgement and insight appear normal. Mood & affect appropriate.   Data Reviewed: I have personally reviewed following labs and imaging studies  CBC: Recent Labs  Lab 07/30/20 1241 07/31/20 0814 08/01/20  0051  WBC 8.8 4.9 6.8  NEUTROABS 6.6 2.8 4.7  HGB 13.1 13.3 13.3  HCT 38.8 39.2 39.3  MCV 95.3 93.6 93.8  PLT 256 294 353   Basic Metabolic Panel: Recent Labs  Lab 07/30/20 1241 07/31/20 0814 08/01/20 0051  NA 138 138 137  K 3.9 4.0 4.1  CL 98 102 104  CO2 23 24 21*  GLUCOSE 106* 96 123*  BUN 12 17 20   CREATININE 0.77 0.66 0.75  CALCIUM 8.6* 9.8 9.1   GFR: Estimated Creatinine Clearance: 81.8 mL/min (by C-G formula based on SCr of 0.75 mg/dL). Liver Function Tests: Recent Labs  Lab 07/30/20 1241 07/31/20 0814 08/01/20 0051  AST 98*  75* 65*  ALT 63* 71* 77*  ALKPHOS 64 57 57  BILITOT 0.4 0.4 0.5  PROT 8.2* 7.7 7.7  ALBUMIN 3.8 4.3 3.8   No results for input(s): LIPASE, AMYLASE in the last 168 hours. No results for input(s): AMMONIA in the last 168 hours. Coagulation Profile: No results for input(s): INR, PROTIME in the last 168 hours. Cardiac Enzymes: No results for input(s): CKTOTAL, CKMB, CKMBINDEX, TROPONINI in the last 168 hours. BNP (last 3 results) No results for input(s): PROBNP in the last 8760 hours. HbA1C: No results for input(s): HGBA1C in the last 72 hours. CBG: No results for input(s): GLUCAP in the last 168 hours. Lipid Profile: No results for input(s): CHOL, HDL, LDLCALC, TRIG, CHOLHDL, LDLDIRECT in the last 72 hours. Thyroid Function Tests: No results for input(s): TSH, T4TOTAL, FREET4, T3FREE, THYROIDAB in the last 72 hours. Anemia Panel: Recent Labs    07/30/20 1558  FERRITIN 1,305*   Urine analysis:    Component Value Date/Time   COLORURINE YELLOW 02/06/2011 0643   APPEARANCEUR CLEAR 02/06/2011 0643   LABSPEC 1.011 02/06/2011 0643   PHURINE 5.5 02/06/2011 0643   HGBUR NEGATIVE 02/06/2011 0643   BILIRUBINUR NEGATIVE 02/06/2011 0643   KETONESUR NEGATIVE 02/06/2011 0643   PROTEINUR NEGATIVE 02/06/2011 0643   UROBILINOGEN 0.2 02/06/2011 0643   NITRITE NEGATIVE 02/06/2011 0643   LEUKOCYTESUR  02/06/2011 0643    NEGATIVE MICROSCOPIC NOT DONE ON URINES WITH NEGATIVE PROTEIN, BLOOD, LEUKOCYTES, NITRITE, OR GLUCOSE <1000 mg/dL.   Recent Results (from the past 240 hour(s))  SARS Coronavirus 2 by RT PCR (hospital order, performed in [facility] hospital lab) Nasopharyngeal Nasopharyngeal Swab     Status: Abnormal   Collection Time: 07/30/20  2:41 PM   Specimen: Nasopharyngeal Swab  Result Value Ref Range Status   SARS Coronavirus 2 POSITIVE (A) NEGATIVE Final    Comment: RESULT CALLED TO, READ BACK BY AND VERIFIED WITH:  CALLED TO MARVA SIMMS AT 1545 BY SROY ON  09/29/20 (NOTE) SARS-CoV-2 target nucleic acids are DETECTED  SARS-CoV-2 RNA is generally detectable in upper respiratory specimens  during the acute phase of infection.  Positive results are indicative  of the presence of the identified virus, but do not rule out bacterial infection or co-infection with other pathogens not detected by the test.  Clinical correlation with patient history and  other diagnostic information is necessary to determine patient infection status.  The expected result is negative.  Fact Sheet for Patients:   161096   Fact Sheet for Healthcare Providers:   BoilerBrush.com.cy    This test is not yet approved or cleared by the https://pope.com/ FDA and  has been authorized for detection and/or diagnosis of SARS-CoV-2 by FDA under an Emergency Use Authorization (EUA).  This EUA will remain in effect (m  eaning this test can be used) for the duration of  the COVID-19 declaration under Section 564(b)(1) of the Act, 21 U.S.C. section 360-bbb-3(b)(1), unless the authorization is terminated or revoked sooner.  Performed at [facility], [address], [place], [place] [address]       Radiology Studies: DG Chest Portable 1 View  Result Date: 07/31/2020 CLINICAL DATA:  Shortness of breath.  COVID. EXAM: PORTABLE CHEST 1 VIEW COMPARISON:  07/30/2020. FINDINGS: Mediastinum hilar structures normal. Heart size normal. Low lung volumes. Mild bilateral interstitial prominence noted suggesting pneumonitis. No pleural effusion or pneumothorax. IMPRESSION: Low lung volumes. Mild bilateral interstitial prominence noted consistent with pneumonitis. Electronically Signed   By: Maisie Fus  Register   On: 07/31/2020 08:43    Scheduled Meds: . enoxaparin (LOVENOX) injection  40 mg Subcutaneous Q24H  . methylPREDNISolone (SOLU-MEDROL) injection  40 mg Intravenous Q12H  . sertraline  50 mg Oral Daily   Continuous  Infusions: . remdesivir 100 mg in NS 100 mL Stopped (08/01/20 1059)     LOS: 1 day   Time spent: 25 minutes.  Tyrone Nine, MD [facility] [email or web address] 08/01/2020, 4:01 PM

## 2020-08-02 LAB — COMPREHENSIVE METABOLIC PANEL
ALT: 83 U/L — ABNORMAL HIGH (ref 0–44)
AST: 51 U/L — ABNORMAL HIGH (ref 15–41)
Albumin: 3.8 g/dL (ref 3.5–5.0)
Alkaline Phosphatase: 58 U/L (ref 38–126)
Anion gap: 13 (ref 5–15)
BUN: 24 mg/dL — ABNORMAL HIGH (ref 6–20)
CO2: 22 mmol/L (ref 22–32)
Calcium: 9.4 mg/dL (ref 8.9–10.3)
Chloride: 104 mmol/L (ref 98–111)
Creatinine, Ser: 0.75 mg/dL (ref 0.44–1.00)
GFR calc Af Amer: >60 mL/min (ref >60)
GFR calc non Af Amer: >60 mL/min (ref >60)
Glucose, Bld: 126 mg/dL — ABNORMAL HIGH (ref 70–99)
Potassium: 5.3 mmol/L — ABNORMAL HIGH (ref 3.5–5.1)
Sodium: 139 mmol/L (ref 135–145)
Total Bilirubin: 0.4 mg/dL (ref 0.3–1.2)
Total Protein: 7.7 g/dL (ref 6.5–8.1)

## 2020-08-02 LAB — C-REACTIVE PROTEIN: CRP: 4.2 mg/dL — ABNORMAL HIGH (ref <1.0)

## 2020-08-02 MED ORDER — SODIUM ZIRCONIUM CYCLOSILICATE 5 G PO PACK
5.0000 g | PACK | Freq: Once | ORAL | Status: AC
  Administered 2020-08-02: 5 g via ORAL
  Filled 2020-08-02: qty 1

## 2020-08-02 MED ORDER — DEXAMETHASONE 6 MG PO TABS
6.0000 mg | ORAL_TABLET | Freq: Every day | ORAL | 0 refills | Status: AC
Start: 2020-08-03 — End: 2020-08-09

## 2020-08-02 NOTE — Plan of Care (Signed)
Discharge instructions reviewed with patient, questions answered verbalized understanding.  Instructed patient on Appointment for Remdesivir on Monday August 9 at 11:30 including where to go for infusion.  Verbalized understanding.  Patient awaiting transportation.  Oxygen in room.

## 2020-08-02 NOTE — Discharge Summary (Signed)
Physician Discharge Summary  Linda BrasLisa M Julson JXB:[id number] DOB: 09-03-1971 DOA: 07/30/2020  PCP: Jomarie LongsBreeback, Jade L, PA-C  Admit date: 07/30/2020 Discharge date: 08/02/2020  Admitted From: Home Disposition: Home   Recommendations for Outpatient Follow-up:  1. Follow up with PCP in 1-2 weeks by telehealth visit and/or after 21 days isolation period ends (08/18/2020). 2. Recheck ambulatory pulse oximetry to confirm when patient no longer requires supplemental oxygen on exertion. 3. Recheck BMP at follow up.  Home Health: None Equipment/Devices: 2L O2 with exertion Discharge Condition: Stable CODE STATUS: Full Diet recommendation: As tolerated  Brief/Interim Summary: Linda Robertson is a 49 y.o. female with a history of panic disorder who presented to the ED 8/4 with progressive shortness of breath in the setting of fatigue, HA, diarrhea and dyspnea having been diagnosed with covid-19 on 8/1 at [facility]. She was hypoxemic on EMS arrival and in the ED requiring 2L O2 with CRP 17.7, PCT 0.72, d-dimer 0.82, and reassuring CXR. Remdesivir and steroids started, admission requested. Boarded in ED with follow up CXR demonstrating bilateral interstitial prominence, ultimately [facility] 8[facility]5. CRP has trended downward and hypoxia at rest has resolved. She is stable for discharge to complete 5th and final dose of remdesivir in outpatient infusion clinic and to complete 10 days of steroids. SpO2 nadir is 88% with ambulation on room air, so exertional supplemental oxygen is to be provided at discharge.  Discharge Diagnoses:  Principal Problem:   Acute respiratory failure due to COVID-19 [facility](HCC)  Acute hypoxemic respiratory failure due to covid-19 pneumonia: SARS-CoV-2 testing positive 8/1, confirmed PCR positive on 8/4. CRP declining significantly, PCT mildly elevated and declined without antibiotics. - continue supplemental oxygen, 2L with exertion. - Continue remdesivir x5 doses, final dose will by at infusion  center. - Continue steroids x10 days (8/4 - 8/13) - Continue airborne, contact precautions for 21 days from positive testing.   Panic disorder, anxiety:  - Continue home medications. Seems to be reasonably well-controlled, though would be expected to exacerbate (and be exacerbated by) symptoms.  Hyperkalemia: Mild, possibly a spurious result, normal renal function. No changes on cardiac monitoring. Lokelma given.  - Recheck at follow up.  Discharge Instructions Discharge Instructions    Discharge instructions   Complete by: As directed    You are being discharged from the hospital after treatment for covid-19 infection. You are felt to be stable enough to no longer require inpatient monitoring, testing, and treatment, though you will need to follow the recommendations below: - Continue taking decadron for 6 more doses - [facility] final [facility] of remdesivir. An appointment on Monday will be scheduled for you before you are discharge.  - Per CDC guidelines, you will need to remain in isolation for 21 days from your first positive covid test. - Follow up with your doctor in the next week via telehealth or seek medical attention right away if your symptoms get WORSE.  - You are still encouraged to get a covid vaccination between 21 days (after isolation period ends) and 90 days (before immunity is thought to wear off). - You may continue to use oxygen with exertion, which will be arranged prior to discharge.   Directions for you at home:  Wear a facemask You should wear a facemask that covers your nose and mouth when you are in the same room with other people and when you visit a healthcare provider. People who live with or visit you should also wear a facemask  while they are in the same room with you.  Separate yourself from other people in your home As much as possible, you should stay in a different room from other people in your home. Also, you should use a  separate bathroom, if available.  Avoid sharing household items You should not share dishes, drinking glasses, cups, eating utensils, towels, bedding, or other items with other people in your home. After using these items, you should wash them thoroughly with soap and water.  Cover your coughs and sneezes Cover your mouth and nose with a tissue when you cough or sneeze, or you can cough or sneeze into your sleeve. Throw used tissues in a lined trash can, and immediately wash your hands with soap and water for at least 20 seconds or use an alcohol-based hand rub.  Wash your [employer] your hands often and thoroughly with soap and water for at least 20 seconds. You can use an alcohol-based hand sanitizer if soap and water are not available and if your hands are not visibly dirty. Avoid touching your eyes, nose, and mouth with unwashed hands.  Directions for those who live with, or provide care at home for you:  Limit the number of people who have contact with the patient If possible, have only one caregiver for the patient. Other household members should stay in another home or place of residence. If this is not possible, they should stay in another room, or be separated from the patient as much as possible. Use a separate bathroom, if available. Restrict visitors who do not have an essential need to be in the home.  Ensure good ventilation Make sure that shared spaces in the home have good air flow, such as from an air conditioner or an opened window, weather permitting.  Wash your hands often Wash your hands often and thoroughly with soap and water for at least 20 seconds. You can use an alcohol based hand sanitizer if soap and water are not available and if your hands are not visibly dirty. Avoid touching your eyes, nose, and mouth with unwashed hands. Use disposable paper towels to dry your hands. If not available, use dedicated cloth towels and replace them when they become  wet.  Wear a facemask and gloves Wear a disposable facemask at all times in the room and gloves when you touch or have contact with the patient's blood, body fluids, and/or secretions or excretions, such as sweat, saliva, sputum, nasal mucus, vomit, urine, or feces.  Ensure the mask fits over your nose and mouth tightly, and do not touch it during use. Throw out disposable facemasks and gloves after using them. Do not reuse. Wash your hands immediately after removing your facemask and gloves. If your personal clothing becomes contaminated, carefully remove clothing and launder. Wash your hands after handling contaminated clothing. Place all used disposable facemasks, gloves, and other waste in a lined container before disposing them with other household waste. Remove gloves and wash your hands immediately after handling these items.  Do not share dishes, glasses, or other household items with the patient Avoid sharing household items. You should not share dishes, drinking glasses, cups, eating utensils, towels, bedding, or other items with a patient who is confirmed to have, or being evaluated for, COVID-19 infection. After the person uses these items, you should wash them thoroughly with soap and water.  Wash laundry thoroughly Immediately remove and wash clothes or bedding that have blood, body fluids, and/or secretions or excretions, such as  sweat, saliva, sputum, nasal mucus, vomit, urine, or feces, on them. Wear gloves when handling laundry from the patient. Read and follow directions on labels of laundry or clothing items and detergent. In general, wash and dry with the warmest temperatures recommended on the label.  Clean all areas the individual has used often Clean all touchable surfaces, such as counters, tabletops, doorknobs, bathroom fixtures, toilets, phones, keyboards, tablets, and bedside tables, every day. Also, clean any surfaces that may have blood, body fluids, and/or  secretions or excretions on them. Wear gloves when cleaning surfaces the patient has come in contact with. Use a diluted bleach solution (e.g., dilute bleach with 1 part bleach and 10 parts water) or a household disinfectant with a label that says EPA-registered for coronaviruses. To make a bleach solution at home, add 1 tablespoon of bleach to 1 quart (4 cups) of water. For a larger supply, add  cup of bleach to 1 gallon (16 cups) of water. Read labels of cleaning products and follow recommendations provided on product labels. Labels contain instructions for safe and effective use of the cleaning product including precautions you should take when applying the product, such as wearing gloves or eye protection and making sure you have good ventilation during use of the product. Remove gloves and wash hands immediately after cleaning.  Monitor yourself for signs and symptoms of illness Caregivers and household members are considered close contacts, should monitor their health, and will be asked to limit movement outside of the home to the extent possible. Follow the monitoring steps for close contacts listed on the symptom monitoring form.  If you have additional questions, contact your local health department or call the epidemiologist on call at [phone] (available 24/7). This guidance is subject to change. For the most up-to-date guidance from [facility], please refer to their website: TripMetro.hu   MyChart COVID-19 home monitoring program   Complete by: Aug 02, 2020    Is the patient willing to use the MyChart Mobile App for home monitoring?: Yes     Allergies as of 08/02/2020      Reactions   Codeine Itching      Medication List    TAKE these medications   ALPRAZolam 0.5 MG tablet Commonly known as: XANAX TAKE ONE TABLET BY MOUTH TWICE A DAY AS NEEDED FOR ANXIETY What changed:   how much to take  how to take this  when  to take this  reasons to take this  additional instructions   dexamethasone 6 MG tablet Commonly known as: Decadron Take 1 tablet (6 mg total) by mouth daily for 6 days. Start taking on: August 03, 2020   sertraline 50 MG tablet Commonly known as: ZOLOFT Take one and one half tablet daily. What changed:   how much to take  how to take this  when to take this  additional instructions            Durable Medical Equipment  (From admission, onward)         Start     Ordered   08/02/20 1022  For home use only DME oxygen  Once       Question Answer Comment  Length of Need 6 Months   Mode or (Route) Nasal cannula   Liters per Minute 2   Frequency Continuous (stationary and portable oxygen unit needed)   Oxygen delivery system Gas      08/02/20 1021          Follow-up Information  Breeback, Jade L, PA-C Follow up.   Specialty: Family Medicine Contact information: [address] [place] [address] [phone]              Allergies  Allergen Reactions  . Codeine Itching    Consultations:  None  Procedures/Studies: DG Chest Portable 1 View  Result Date: 07/31/2020 CLINICAL DATA:  Shortness of breath.  COVID. EXAM: PORTABLE CHEST 1 VIEW COMPARISON:  07/30/2020. FINDINGS: Mediastinum hilar structures normal. Heart size normal. Low lung volumes. Mild bilateral interstitial prominence noted suggesting pneumonitis. No pleural effusion or pneumothorax. IMPRESSION: Low lung volumes. Mild bilateral interstitial prominence noted consistent with pneumonitis. Electronically Signed   By: Maisie Fus  Register   On: 07/31/2020 08:43   DG Chest Port 1 View  Result Date: 07/30/2020 CLINICAL DATA:  c/o SHOB, Covid+, sx since Friday EXAM: PORTABLE CHEST 1 VIEW COMPARISON:  Chest radiograph 07/14/2017 FINDINGS: The heart size and mediastinal contours are within normal limits. The lungs are clear. No pneumothorax or significant pleural effusion. The  visualized skeletal structures are unremarkable. IMPRESSION: No acute cardiopulmonary process. Electronically Signed   By: Emmaline Kluver M.D.   On: 07/30/2020 12:23   Subjective: Cough is stable, shortness of breath better. Has been getting up to bathroom without oxygen but feels shortness of breath. Ambulated in room with RN this AM and dropped to 88% she says, wants to have oxygen at home. Chest pain only with coughing, no changes. No leg swelling or orthopnea.  Discharge Exam: Vitals:   08/02/20 0647 08/02/20 0820  BP: (!) 115/59   Pulse: (!) 49   Resp: 18   Temp: 98.4 F (36.9 C)   SpO2: 93% 93%   General: Pt is alert, awake, not in acute distress Cardiovascular: RRR, S1/S2 +, no rubs, no gallops Respiratory: Nonlabored and clear Abdominal: Soft, NT, ND, bowel sounds + Extremities: No edema, no cyanosis  Labs: Basic Metabolic Panel: Recent Labs  Lab 07/30/20 1241 07/31/20 0814 08/01/20 0051 08/02/20 0432  NA 138 138 137 139  K 3.9 4.0 4.1 5.3*  CL 98 102 104 104  CO2 23 24 21* 22  GLUCOSE 106* 96 123* 126*  BUN 12 17 20  24*  CREATININE 0.77 0.66 0.75 0.75  CALCIUM 8.6* 9.8 9.1 9.4   Liver Function Tests: Recent Labs  Lab 07/30/20 1241 07/31/20 0814 08/01/20 0051 08/02/20 0432  AST 98* 75* 65* 51*  ALT 63* 71* 77* 83*  ALKPHOS 64 57 57 58  BILITOT 0.4 0.4 0.5 0.4  PROT 8.2* 7.7 7.7 7.7  ALBUMIN 3.8 4.3 3.8 3.8   CBC: Recent Labs  Lab 07/30/20 1241 07/31/20 0814 08/01/20 0051  WBC 8.8 4.9 6.8  NEUTROABS 6.6 2.8 4.7  HGB 13.1 13.3 13.3  HCT 38.8 39.2 39.3  MCV 95.3 93.6 93.8  PLT 256 294 353   D-Dimer Recent Labs    07/31/20 0814 08/01/20 0051  DDIMER 0.74* 0.64*   Microbiology Recent Results (from the past 240 hour(s))  SARS Coronavirus 2 by RT PCR (hospital order, performed in [facility] hospital lab) Nasopharyngeal Nasopharyngeal Swab     Status: Abnormal   Collection Time: 07/30/20  2:41 PM   Specimen: Nasopharyngeal Swab  Result  Value Ref Range Status   SARS Coronavirus 2 POSITIVE (A) NEGATIVE Final    Comment: RESULT CALLED TO, READ BACK BY AND VERIFIED WITH:  CALLED TO MARVA SIMMS AT 1545 BY SROY ON 09/29/20 (NOTE) SARS-CoV-2 target nucleic acids are DETECTED  SARS-CoV-2  RNA is generally detectable in upper respiratory specimens  during the acute phase of infection.  Positive results are indicative  of the presence of the identified virus, but do not rule out bacterial infection or co-infection with other pathogens not detected by the test.  Clinical correlation with patient history and  other diagnostic information is necessary to determine patient infection status.  The expected result is negative.  Fact Sheet for Patients:   BoilerBrush.com.cy   Fact Sheet for Healthcare Providers:   https://pope.com/    This test is not yet approved or cleared by the [place] FDA and  has been authorized for detection and/or diagnosis of SARS-CoV-2 by FDA under an Emergency Use Authorization (EUA).  This EUA will remain in effect (m eaning this test can be used) for the duration of  the COVID-19 declaration under Section 564(b)(1) of the Act, 21 U.S.C. section 360-bbb-3(b)(1), unless the authorization is terminated or revoked sooner.  Performed at [facility], [address], [place], [place] [address]     Time coordinating discharge: Approximately 40 minutes  Tyrone Nine, MD  [facility] 08/02/2020, 10:36 AM

## 2020-08-02 NOTE — Discharge Instructions (Addendum)
Patient scheduled for outpatient Remdesivir infusion at 11:30 AM on Monday 8/9 at [facility]. Please inform the patient to park at [address] [place], [place], as staff will be escorting the patient through the east entrance of the hospital.    There is a wave flag banner located near the entrance on [address]. Turn into this entrance and immediately turn left and park in 1 of the 5 designated Covid Infusion Parking spots. There is a phone number on the sign, please call and let the staff know what spot you are in and we will come out and get you. For questions call [phone].  Thanks.

## 2020-08-02 NOTE — Progress Notes (Signed)
SATURATION QUALIFICATIONS: (This note is used to comply with regulatory documentation for home oxygen)  Patient Saturations on Room Air at Rest = 93%  Patient Saturations on Room Air while Ambulating = 87%  Patient Saturations on 2 Liters of oxygen while Ambulating = 93%  Please briefly explain why patient needs home oxygen: desaturation with exertion

## 2020-08-02 NOTE — Progress Notes (Signed)
Patient scheduled for outpatient Remdesivir infusion at 11:30 AM on Monday 8/9 park at [address] [place], [place], as staff will be escorting the patient through the east entrance of the hospital.    There is a wave flag banner located near the entrance on [address]. Turn into this entrance and immediately turn left and park in 1 of the 5 designated Covid Infusion Parking spots. There is a phone number on the sign, please call and let the staff know what spot you are in and we will come out and get you. For questions call [phone].  Thanks.

## 2020-08-02 NOTE — TOC Initial Note (Signed)
Transition of Care [facility]) - Initial/Assessment Note    Patient Details  Name: Linda Robertson MRN: [id number] Date of Birth: [birth year]  Transition of Care (TOC) CM/SW Contact:    Armanda Heritage, RN Phone Number: 08/02/2020, 10:51 AM  Clinical Narrative:                 [employer] to provide home oxygen.  Expected Discharge Plan: Home/Self Care Barriers to Discharge: No Barriers Identified   Patient Goals and CMS Choice Patient states their goals for this hospitalization and ongoing recovery are:: to go home      Expected Discharge Plan and Services Expected Discharge Plan: Home/Self Care   Discharge Planning Services: CM Consult   Living arrangements for the past 2 months: Single Family Home Expected Discharge Date: 08/02/20               DME Arranged: Oxygen DME Agency: [employer] Date DME Agency Contacted: 08/02/20 Time DME Agency Contacted: 1050 Representative spoke with at DME Agency: [place]            Prior Living Arrangements/Services Living arrangements for the past 2 months: Single Family Home   Patient language and need for interpreter reviewed:: Yes Do you feel safe going back to the place where you live?: Yes      Need for Family Participation in Patient Care: No (Comment) Care giver support system in place?: No (comment)   Criminal Activity/Legal Involvement Pertinent to Current Situation/Hospitalization: No - Comment as needed  Activities of Daily Living Home Assistive Devices/Equipment: None ADL Screening (condition at time of admission) Patient's cognitive ability adequate to safely complete daily activities?: Yes Is the patient deaf or have difficulty hearing?: No Does the patient have difficulty seeing, even when wearing glasses/contacts?: No Does the patient have difficulty concentrating, remembering, or making decisions?: No Patient able to express need for assistance with ADLs?: Yes Does the patient have difficulty dressing or bathing?:  No Independently performs ADLs?: Yes (appropriate for developmental age) Does the patient have difficulty walking or climbing stairs?: No Weakness of Legs: None Weakness of Arms/Hands: None  Permission Sought/Granted                  Emotional Assessment Appearance:: Appears stated age         Psych Involvement: No (comment)  Admission diagnosis:  COVID-19 virus infection [U07.1] COVID-19 [U07.1] Acute respiratory failure due to COVID-19 (HCC) [U07.1, J96.00] Patient Active Problem List   Diagnosis Date Noted  . Acute respiratory failure due to COVID-19 (HCC) 07/30/2020  . Hyperlipidemia 01/24/2020  . Hypertriglyceridemia 01/24/2020  . Left upper quadrant pain 01/23/2020  . Constipation 01/23/2020  . Bloating 01/23/2020  . Abnormal weight gain 01/23/2020  . Atypical chest pain 08/11/2017  . Chest pain 07/14/2017  . Heel pain, bilateral 06/01/2016  . Post-menopausal 03/16/2016  . Vitamin D insufficiency 03/16/2016  . Insomnia 03/16/2016  . Dizziness 03/16/2016  . No energy 03/16/2016  . Hot flashes 03/16/2016  . Panic disorder 08/16/2014  . Left ankle injury 12/06/2011  . Right shoulder pain 11/29/2011   PCP:  Jomarie Longs, PA-C Pharmacy:   [facility] [place], [place] - [address] [place] [address] Phone: [phone] Fax: [phone]     Social Determinants of Health (SDOH) Interventions    Readmission Risk Interventions No flowsheet data found.

## 2020-08-04 ENCOUNTER — Ambulatory Visit (HOSPITAL_COMMUNITY)
Admit: 2020-08-04 | Discharge: 2020-08-04 | Disposition: A | Discharge status: Home / Self Care | Payer: 59 | Attending: Pulmonary Disease | Admitting: Pulmonary Disease

## 2020-08-04 DIAGNOSIS — J1282 Pneumonia due to coronavirus disease 2019: Secondary | ICD-10-CM | POA: Insufficient documentation

## 2020-08-04 DIAGNOSIS — U071 COVID-19: Secondary | ICD-10-CM | POA: Diagnosis present

## 2020-08-04 MED ORDER — SODIUM CHLORIDE 0.9 % IV SOLN
100.0000 mg | Freq: Once | INTRAVENOUS | Status: AC
  Administered 2020-08-04: 100 mg via INTRAVENOUS
  Filled 2020-08-04: qty 20

## 2020-08-04 MED ORDER — EPINEPHRINE 0.3 MG/0.3ML IJ SOAJ: 0.3000 mg | Freq: Once | INTRAMUSCULAR | Status: DC | PRN

## 2020-08-04 MED ORDER — SODIUM CHLORIDE 0.9 % IV SOLN: INTRAVENOUS | Status: DC | PRN

## 2020-08-04 MED ORDER — ALBUTEROL SULFATE HFA 108 (90 BASE) MCG/ACT IN AERS: 2.0000 | INHALATION_SPRAY | Freq: Once | RESPIRATORY_TRACT | Status: DC | PRN

## 2020-08-04 MED ORDER — METHYLPREDNISOLONE SODIUM SUCC 125 MG IJ SOLR: 125.0000 mg | Freq: Once | INTRAMUSCULAR | Status: DC | PRN

## 2020-08-04 MED ORDER — FAMOTIDINE IN NACL 20-0.9 MG/50ML-% IV SOLN: 20.0000 mg | Freq: Once | INTRAVENOUS | Status: DC | PRN

## 2020-08-04 MED ORDER — DIPHENHYDRAMINE HCL 50 MG/ML IJ SOLN: 50.0000 mg | Freq: Once | INTRAMUSCULAR | Status: DC | PRN

## 2020-08-04 NOTE — Progress Notes (Signed)
  Diagnosis: COVID-19  Physician: DR. Shan Levans  Procedure: Covid Infusion Clinic Med: remdesivir infusion - Provided patient with remdesivir fact sheet for patients, parents and caregivers prior to infusion.  Complications: No immediate complications noted.  Discharge: Discharged home   Essie Hart 08/04/2020

## 2020-08-04 NOTE — Discharge Instructions (Signed)
10 Things You Can Do to Manage Your COVID-19 Symptoms at Home If you have possible or confirmed COVID-19: 1. Stay home from work and school. And stay away from other public places. If you must go out, avoid using any kind of public transportation, ridesharing, or taxis. 2. Monitor your symptoms carefully. If your symptoms get worse, call your healthcare provider immediately. 3. Get rest and stay hydrated. 4. If you have a medical appointment, call the healthcare provider ahead of time and tell them that you have or may have COVID-19. 5. For medical emergencies, call 911 and notify the dispatch personnel that you have or may have COVID-19. 6. Cover your cough and sneezes with a tissue or use the inside of your elbow. 7. Wash your hands often with soap and water for at least 20 seconds or clean your hands with an alcohol-based hand sanitizer that contains at least 60% alcohol. 8. As much as possible, stay in a specific room and away from other people in your home. Also, you should use a separate bathroom, if available. If you need to be around other people in or outside of the home, wear a mask. 9. Avoid sharing personal items with other people in your household, like dishes, towels, and bedding. 10. Clean all surfaces that are touched often, like counters, tabletops, and doorknobs. Use household cleaning sprays or wipes according to the label instructions. cdc.gov/coronavirus 06/27/2019 This information is not intended to replace advice given to you by your health care provider. Make sure you discuss any questions you have with your health care provider. Document Revised: 11/29/2019 Document Reviewed: 11/29/2019 Elsevier Patient Education  2020 Elsevier Inc.  

## 2020-08-05 ENCOUNTER — Telehealth: Payer: Self-pay | Admitting: Neurology

## 2020-08-05 NOTE — Telephone Encounter (Signed)
Jomarie Longs, PA-C sent to Silvio Pate, CMA Please call patient beginning of next week if discharged to follow up and see how she is doing.    S/P Covid/hospitalization.   Left message on machine for patient to call back to see how she is feeling.

## 2020-08-06 NOTE — Telephone Encounter (Signed)
Spoke with patient. She states she is doing somewhat better. Oxygen levels have been good. Appt made for virtual follow up next week.

## 2020-08-12 ENCOUNTER — Encounter: Payer: Self-pay | Admitting: Physician Assistant

## 2020-08-12 ENCOUNTER — Telehealth (INDEPENDENT_AMBULATORY_CARE_PROVIDER_SITE_OTHER): Payer: 59 | Admitting: Physician Assistant

## 2020-08-12 VITALS — BP 110/59 | HR 88 | Temp 97.6°F | Ht 67.0 in | Wt 145.0 lb

## 2020-08-12 DIAGNOSIS — U071 COVID-19: Secondary | ICD-10-CM | POA: Diagnosis not present

## 2020-08-12 DIAGNOSIS — J069 Acute upper respiratory infection, unspecified: Secondary | ICD-10-CM | POA: Insufficient documentation

## 2020-08-12 DIAGNOSIS — E875 Hyperkalemia: Secondary | ICD-10-CM | POA: Insufficient documentation

## 2020-08-12 DIAGNOSIS — R748 Abnormal levels of other serum enzymes: Secondary | ICD-10-CM | POA: Diagnosis not present

## 2020-08-12 DIAGNOSIS — R0602 Shortness of breath: Secondary | ICD-10-CM | POA: Insufficient documentation

## 2020-08-12 MED ORDER — ALBUTEROL SULFATE HFA 108 (90 BASE) MCG/ACT IN AERS: 2.0000 | INHALATION_SPRAY | Freq: Four times a day (QID) | RESPIRATORY_TRACT | 0 refills | Status: DC | PRN

## 2020-08-12 NOTE — Progress Notes (Signed)
Slowly feeling better - not having to use oxygen for the last week Some pressure when breathing deep in the mornings No new symptoms  Will take BP reading and give to provider

## 2020-08-12 NOTE — Progress Notes (Signed)
Patient ID: Linda Robertson, female   DOB: 04-01-1971, 49 y.o.   MRN: [id number] .Marland KitchenVirtual Visit via Video Note  I connected with SHEMEIKA STARZYK on 08/12/20 at 11:30 AM EDT by a video enabled telemedicine application and verified that I am speaking with the correct person using two identifiers.  Location: Patient: home Provider: clinic   I discussed the limitations of evaluation and management by telemedicine and the availability of in person appointments. The patient expressed understanding and agreed to proceed.  History of Present Illness: Pt is a 49 yo female who calls in to follow up after covid 19 infection with acute respiratory failure. She was dx with covid on 8/1 and went to ED on 8/4. She was given steroids and remdesivir. She was discharged on 08/02/2020. She has not had to use O2 in 5 days. She is improving. She is still significantly SOB.  Her pulse ox is 90-94 percent on average. She continues to have cough and some production. No fever, chills, headache, body aches. She is ready to go back to work 1/2 days. She is not exercising yet. She wonders about next steps to getting better. She is still very fatigued.   .. Active Ambulatory Problems    Diagnosis Date Noted  . Right shoulder pain 11/29/2011  . Left ankle injury 12/06/2011  . Panic disorder 08/16/2014  . Post-menopausal 03/16/2016  . Vitamin D insufficiency 03/16/2016  . Insomnia 03/16/2016  . Dizziness 03/16/2016  . No energy 03/16/2016  . Hot flashes 03/16/2016  . Heel pain, bilateral 06/01/2016  . Chest pain 07/14/2017  . Atypical chest pain 08/11/2017  . Left upper quadrant pain 01/23/2020  . Constipation 01/23/2020  . Bloating 01/23/2020  . Abnormal weight gain 01/23/2020  . Hyperlipidemia 01/24/2020  . Hypertriglyceridemia 01/24/2020  . COVID-19 virus infection 07/30/2020  . SOB (shortness of breath) 08/12/2020  . Acute respiratory disease due to COVID-19 virus 08/12/2020  . Elevated liver enzymes  08/12/2020  . Hyperkalemia 08/12/2020   Resolved Ambulatory Problems    Diagnosis Date Noted  . No Resolved Ambulatory Problems   Past Medical History:  Diagnosis Date  . Anxiety    Reviewed med, allergy, problem list.     Observations/Objective: No acute distress Dry cough with no labored breathing.  Fatigued appearance.  Normal mood.   .. Today's Vitals   08/12/20 1108  BP: (!) 110/59  Pulse: 88  Temp: 97.6 F (36.4 C)  TempSrc: Oral  SpO2: 94%  Weight: 145 lb (65.8 kg)  Height: 5\' 7"  (1.702 m)   Body mass index is 22.71 kg/m.    Assessment and Plan: Marland KitchenElise was seen today for hospitalization follow-up.  Diagnoses and all orders for this visit:  Acute respiratory disease due to COVID-19 virus -     albuterol (VENTOLIN HFA) 108 (90 Base) MCG/ACT inhaler; Inhale 2 puffs into the lungs every 6 (six) hours as needed. -     COMPLETE METABOLIC PANEL WITH GFR -     C-reactive protein  Hyperkalemia -     COMPLETE METABOLIC PANEL WITH GFR  COVID-19 virus infection -     albuterol (VENTOLIN HFA) 108 (90 Base) MCG/ACT inhaler; Inhale 2 puffs into the lungs every 6 (six) hours as needed. -     COMPLETE METABOLIC PANEL WITH GFR -     C-reactive protein  Elevated liver enzymes -     COMPLETE METABOLIC PANEL WITH GFR  SOB (shortness of breath) -     albuterol (VENTOLIN  HFA) 108 (90 Base) MCG/ACT inhaler; Inhale 2 puffs into the lungs every 6 (six) hours as needed. -     COMPLETE METABOLIC PANEL WITH GFR   Needs labs to recheck elevated liver enzymes, potassium in hospital.  Start ASA 81mg  daily for the next 2 months to prevent blood clots.  Ok to continue mucinex for residual production cough.  Discussed how to breath deep 5 times an hour.  Watch pulse ox do not let get below 90 percent without putting some O2 on.  Albuterol added to use as needed.  Ok to work 1/2 days for 2 weeks then proceed to full days.    Follow Up Instructions:    I discussed the  assessment and treatment plan with the patient. The patient was provided an opportunity to ask questions and all were answered. The patient agreed with the plan and demonstrated an understanding of the instructions.   The patient was advised to call back or seek an in-person evaluation if the symptoms worsen or if the condition fails to improve as anticipated.  I provided 15 minutes of non-face-to-face time during this encounter.   , PA-C

## 2020-08-13 LAB — COMPLETE METABOLIC PANEL WITH GFR
AG Ratio: 1.5 (calc) (ref 1.0–2.5)
ALT: 22 U/L (ref 6–29)
AST: 13 U/L (ref 10–35)
Albumin: 3.8 g/dL (ref 3.6–5.1)
Alkaline phosphatase (APISO): 68 U/L (ref 37–153)
BUN: 20 mg/dL (ref 7–25)
CO2: 28 mmol/L (ref 20–32)
Calcium: 8.9 mg/dL (ref 8.6–10.4)
Chloride: 101 mmol/L (ref 98–110)
Creat: 0.7 mg/dL (ref 0.50–1.05)
GFR, Est African American: 117 mL/min/{1.73_m2} (ref >=60)
GFR, Est Non African American: 101 mL/min/{1.73_m2} (ref >=60)
Globulin: 2.6 g/dL (calc) (ref 1.9–3.7)
Glucose, Bld: 87 mg/dL (ref 65–99)
Potassium: 4.4 mmol/L (ref 3.5–5.3)
Sodium: 137 mmol/L (ref 135–146)
Total Bilirubin: 0.4 mg/dL (ref 0.2–1.2)
Total Protein: 6.4 g/dL (ref 6.1–8.1)

## 2020-08-13 LAB — C-REACTIVE PROTEIN: CRP: 17.5 mg/L — ABNORMAL HIGH (ref <8.0)

## 2020-08-14 ENCOUNTER — Other Ambulatory Visit: Payer: Self-pay | Admitting: Neurology

## 2020-08-14 DIAGNOSIS — R7982 Elevated C-reactive protein (CRP): Secondary | ICD-10-CM

## 2020-08-14 NOTE — Progress Notes (Signed)
Linda Robertson,   Kidney function looks great.  Glucose good.  Liver enzymes normal again.  CRP-inflammation is registering as high as first diagnosis.  Reminder to start that baby ASA.  Let me know if not continuing to feel better.  Recheck CRP in 2 weeks.

## 2020-08-25 ENCOUNTER — Telehealth: Payer: Self-pay | Admitting: Neurology

## 2020-08-25 NOTE — Telephone Encounter (Signed)
Did patient need a follow up chest xray?

## 2020-08-25 NOTE — Telephone Encounter (Signed)
Unless she has residual cough/SOB does not need CXR but does need the CRP. That is ordered so she should be able to have drawn. Does she need a note with full release? She is having any problems ambulating or with the half days?

## 2020-08-25 NOTE — Telephone Encounter (Signed)
Patient left vm with questions of whether she needs a follow up chest xray and about returning to work full time.   It looks like from note on 08/12/2020: Ok to work 1/2 days for 2 weeks then proceed to full days

## 2020-08-25 NOTE — Telephone Encounter (Signed)
Spoke with patient. She was made aware that no chest xray needed unless symptoms change/get worse. Needs repeat labs and she is aware of that.   She is doing okay with half days. Does get tired easily and winded going up stairs. Has not had to use oxygen any more. She would like to be released back to full days. Note written and she will print from [place].   Carol Loftin - FYI.

## 2020-08-25 NOTE — Telephone Encounter (Signed)
Perfect

## 2020-08-29 LAB — C-REACTIVE PROTEIN: CRP: 3.7 mg/L (ref <8.0)

## 2020-09-02 NOTE — Progress Notes (Signed)
Linda Robertson,   GREAT news. CRP normal range. Your bodies inflammation has drastically decreased.

## 2020-09-10 ENCOUNTER — Other Ambulatory Visit: Payer: Self-pay | Admitting: Physician Assistant

## 2020-09-10 DIAGNOSIS — F410 Panic disorder [episodic paroxysmal anxiety] without agoraphobia: Secondary | ICD-10-CM

## 2020-09-10 NOTE — Telephone Encounter (Signed)
Last written 01/23/2020 #60 with 2 refills Last appt 08/12/2020 (sick visit) Last regular appt 01/23/2020

## 2020-10-31 ENCOUNTER — Other Ambulatory Visit: Payer: Self-pay | Admitting: Physician Assistant

## 2020-10-31 DIAGNOSIS — F410 Panic disorder [episodic paroxysmal anxiety] without agoraphobia: Secondary | ICD-10-CM

## 2020-11-01 IMAGING — DX DG ABDOMEN 2V
2 series · 2 of 2 positions shown · non-contrast
Comparison: None.

CLINICAL DATA: Abdominal pain, constipation and bloating for a few
months. Pain worse on the left.

EXAM:
ABDOMEN - 2 VIEW

[abdomen erect]
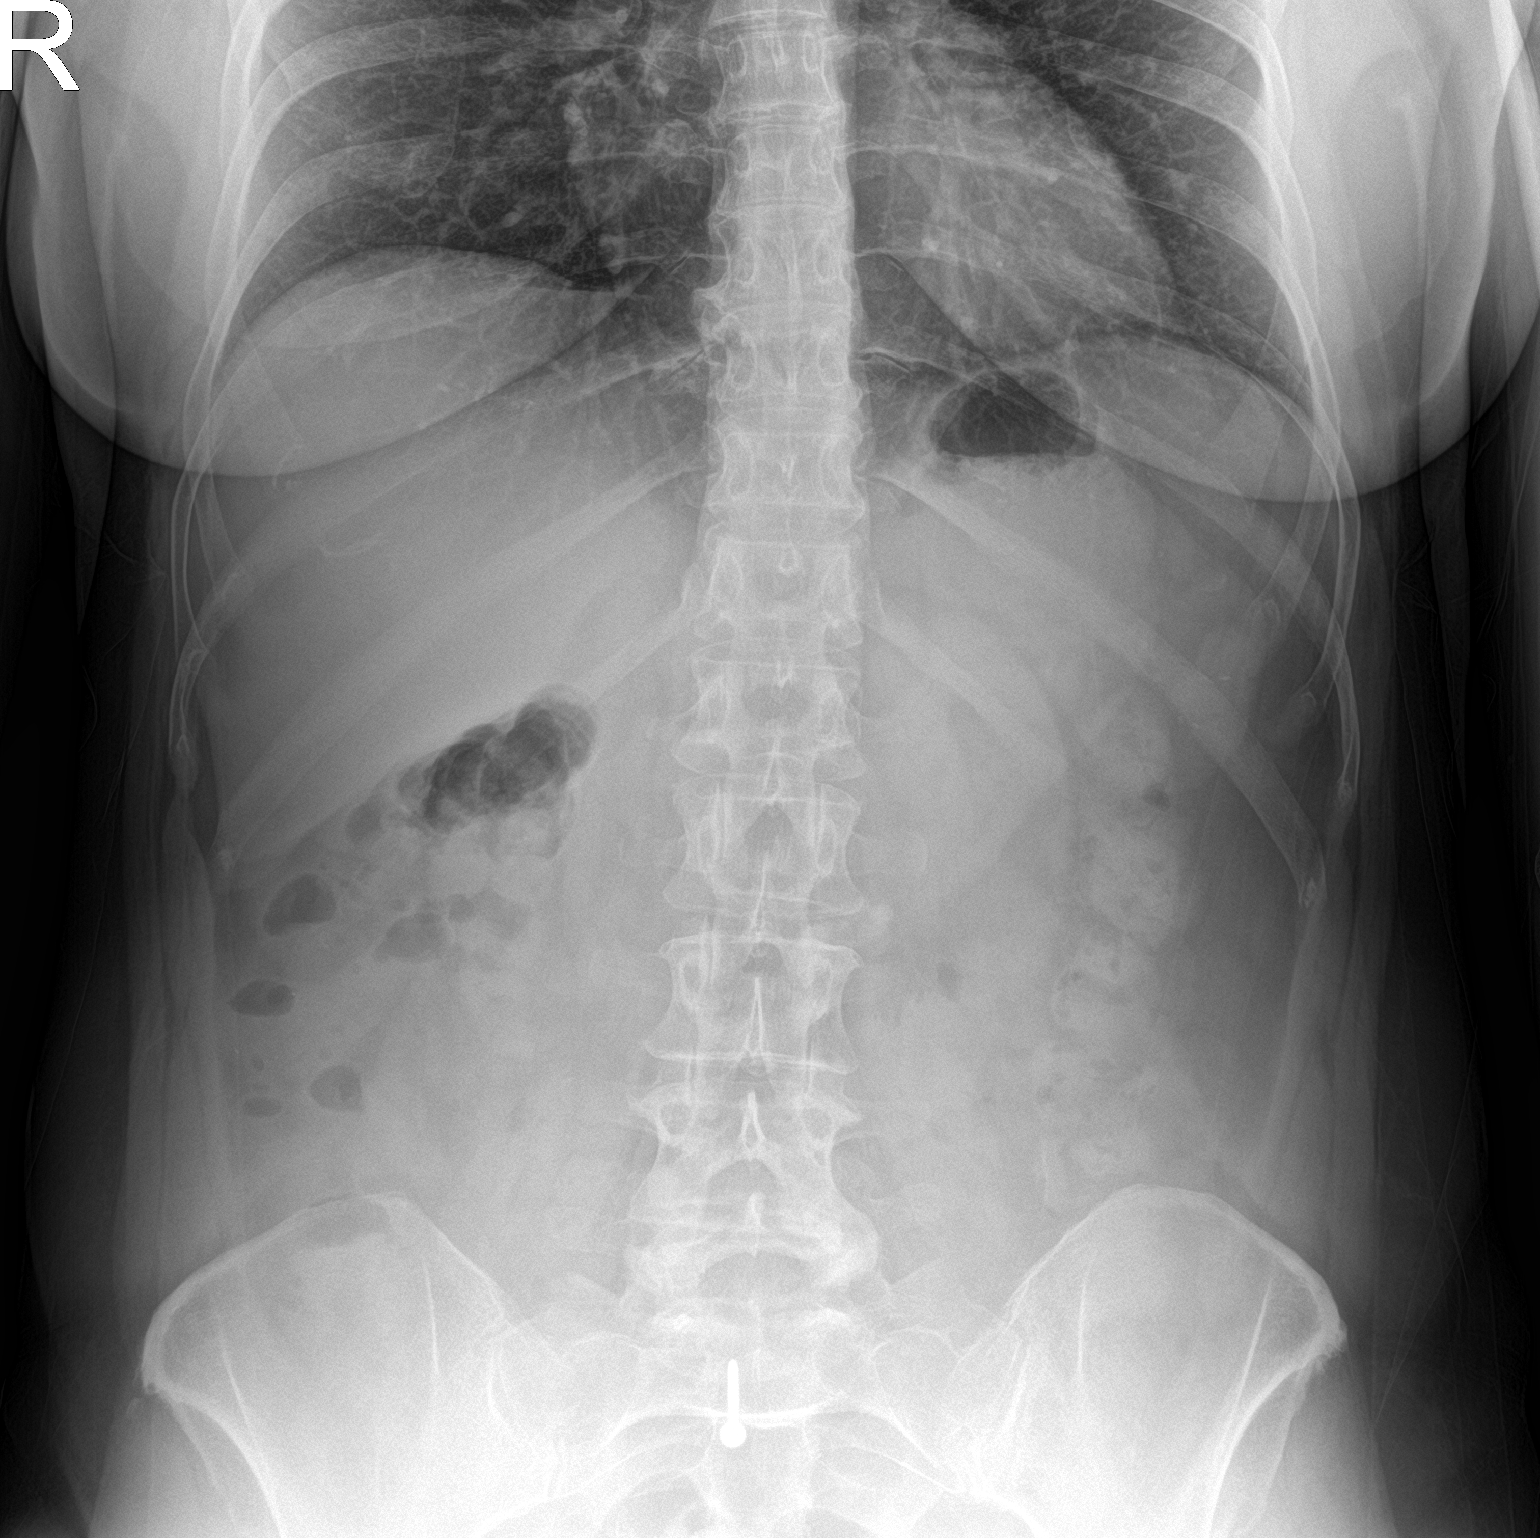

[abdomen supine]
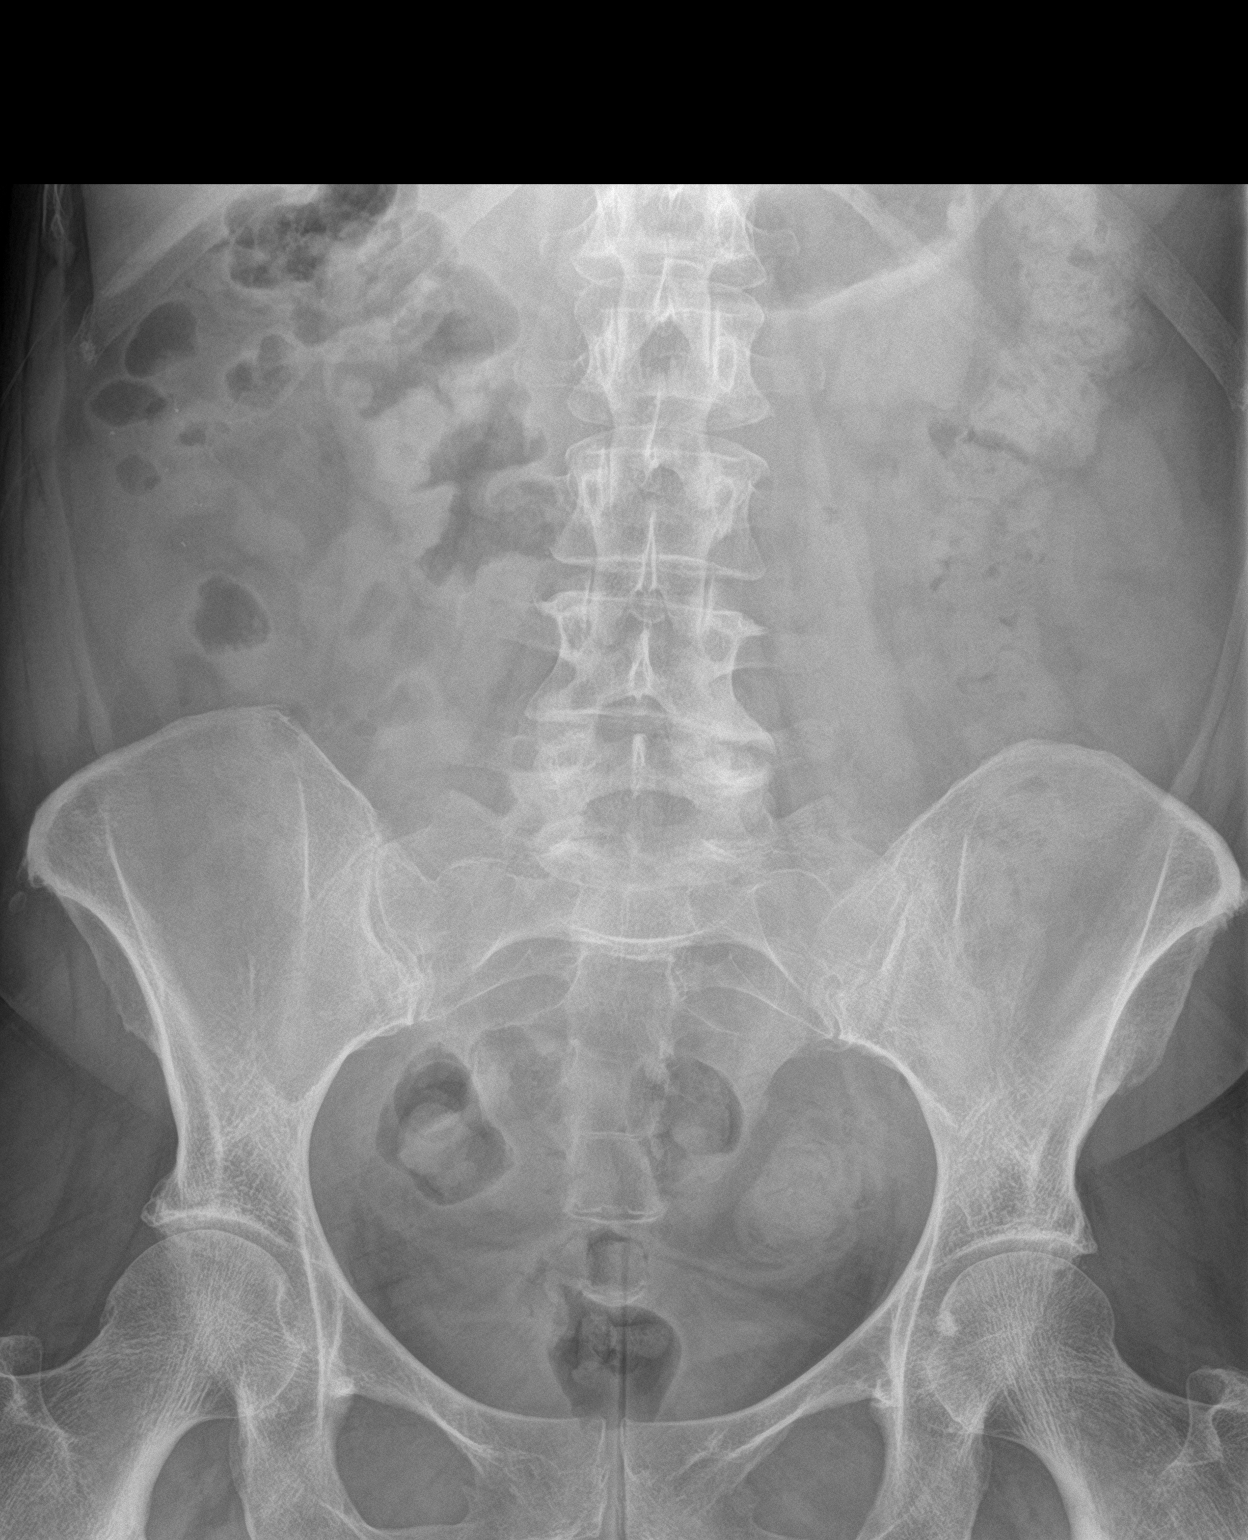

[2 of 2 positions shown; findings below may reference images not displayed]

FINDINGS: Normal bowel gas pattern.

Soft tissues are within normal limits.

No free air.

Skeletal structures are unremarkable.
IMPRESSION: Negative.

## 2021-02-12 ENCOUNTER — Encounter: Payer: Self-pay | Admitting: Internal Medicine

## 2021-03-27 ENCOUNTER — Ambulatory Visit (AMBULATORY_SURGERY_CENTER): Payer: Self-pay

## 2021-03-27 ENCOUNTER — Other Ambulatory Visit: Payer: Self-pay

## 2021-03-27 VITALS — Ht 66.0 in | Wt 169.0 lb

## 2021-03-27 DIAGNOSIS — Z1211 Encounter for screening for malignant neoplasm of colon: Secondary | ICD-10-CM

## 2021-03-27 MED ORDER — SUTAB 1479-225-188 MG PO TABS: 12.0000 | ORAL_TABLET | ORAL | 0 refills | Status: DC

## 2021-03-27 NOTE — Progress Notes (Signed)
No egg or soy allergy known to patient  No issues with past sedation with any surgeries or procedures Patient denies ever being told they had issues or difficulty with intubation  No FH of Malignant Hyperthermia No diet pills per patient No home 02 use per patient  No blood thinners per patient  Pt denies issues with constipation  No A fib or A flutter  EMMI video to pt or via MyChart  COVID 19 guidelines implemented in PV today with Pt and RN   Pt is fully vaccinated  for Covid   [place]  Coupon given to pt in PV today , Code to Pharmacy and  NO PA's for preps discussed with pt In PV today  Discussed with pt there will be an out-of-pocket cost for prep and that varies from $0 to 70 +  dollars   Due to the COVID-19 pandemic we are asking patients to follow certain guidelines.  Pt aware of COVID protocols and [facility] guidelines   

## 2021-03-29 ENCOUNTER — Other Ambulatory Visit: Payer: Self-pay | Admitting: Physician Assistant

## 2021-03-29 DIAGNOSIS — F410 Panic disorder [episodic paroxysmal anxiety] without agoraphobia: Secondary | ICD-10-CM

## 2021-03-30 NOTE — Telephone Encounter (Signed)
We do need her to schedule appt but will refill for this month.

## 2021-04-09 ENCOUNTER — Encounter: Payer: Self-pay | Admitting: Internal Medicine

## 2021-04-15 ENCOUNTER — Other Ambulatory Visit: Payer: Self-pay

## 2021-04-15 ENCOUNTER — Ambulatory Visit (AMBULATORY_SURGERY_CENTER): Payer: 59 | Admitting: Internal Medicine

## 2021-04-15 ENCOUNTER — Encounter: Payer: Self-pay | Admitting: Internal Medicine

## 2021-04-15 VITALS — BP 117/71 | HR 51 | Temp 98.6°F | Resp 14 | Ht 66.0 in | Wt 169.0 lb

## 2021-04-15 DIAGNOSIS — Z1211 Encounter for screening for malignant neoplasm of colon: Secondary | ICD-10-CM

## 2021-04-15 MED ORDER — SODIUM CHLORIDE 0.9 % IV SOLN: 500.0000 mL | Freq: Once | INTRAVENOUS | Status: DC

## 2021-04-15 NOTE — Progress Notes (Signed)
To PACU, VSS. Report to [email or web address] 

## 2021-04-15 NOTE — Patient Instructions (Signed)
Please read handouts provided. Continue present medications. Repeat colonoscopy in 10 years for screening purposes.    YOU HAD AN ENDOSCOPIC PROCEDURE TODAY AT THE [facility]:   Refer to the procedure report that was given to you for any specific questions about what was found during the examination.  If the procedure report does not answer your questions, please call your gastroenterologist to clarify.  If you requested that your care partner not be given the details of your procedure findings, then the procedure report has been included in a sealed envelope for you to review at your convenience later.  YOU SHOULD EXPECT: Some feelings of bloating in the abdomen. Passage of more gas than usual.  Walking can help get rid of the air that was put into your GI tract during the procedure and reduce the bloating. If you had a lower endoscopy (such as a colonoscopy or flexible sigmoidoscopy) you may notice spotting of blood in your stool or on the toilet paper. If you underwent a bowel prep for your procedure, you may not have a normal bowel movement for a few days.  Please Note:  You might notice some irritation and congestion in your nose or some drainage.  This is from the oxygen used during your procedure.  There is no need for concern and it should clear up in a day or so.  SYMPTOMS TO REPORT IMMEDIATELY:   Following lower endoscopy (colonoscopy or flexible sigmoidoscopy):  Excessive amounts of blood in the stool  Significant tenderness or worsening of abdominal pains  Swelling of the abdomen that is new, acute  Fever of 100F or higher   For urgent or emergent issues, a gastroenterologist can be reached at any hour by calling [phone]. Do not use MyChart messaging for urgent concerns.    DIET:  We do recommend a small meal at first, but then you may proceed to your regular diet.  Drink plenty of fluids but you should avoid alcoholic beverages for 24 hours.  ACTIVITY:   You should plan to take it easy for the rest of today and you should NOT DRIVE or use heavy machinery until tomorrow (because of the sedation medicines used during the test).    FOLLOW UP: Our staff will call the number listed on your records 48-72 hours following your procedure to check on you and address any questions or concerns that you may have regarding the information given to you following your procedure. If we do not reach you, we will leave a message.  We will attempt to reach you two times.  During this call, we will ask if you have developed any symptoms of COVID 19. If you develop any symptoms (ie: fever, flu-like symptoms, shortness of breath, cough etc.) before then, please call [phone].  If you test positive for Covid 19 in the 2 weeks post procedure, please call and report this information to us.    If any biopsies were taken you will be contacted by phone or by letter within the next 1-3 weeks.  Please call us at [phone] if you have not heard about the biopsies in 3 weeks.    SIGNATURES/CONFIDENTIALITY: You and/or your care partner have signed paperwork which will be entered into your electronic medical record.  These signatures attest to the fact that that the information above on your After Visit Summary has been reviewed and is understood.  Full responsibility of the confidentiality of this discharge information lies with you and/or your care-partner. 

## 2021-04-15 NOTE — Op Note (Signed)
[facility] Patient Name: Linda Robertson Procedure Date: 04/15/2021 11:22 AM MRN: [id number] Endoscopist: Beverley Fiedler , MD Age: 50 Referring MD:  Date of Birth: Oct 24, 1971 Gender: Female Account #: [id number] Procedure:                Colonoscopy Indications:              Screening for colorectal malignant neoplasm, This                            is the patient's first colonoscopy Medicines:                Monitored Anesthesia Care Procedure:                Pre-Anesthesia Assessment:                           - Prior to the procedure, a History and Physical                            was performed, and patient medications and                            allergies were reviewed. The patient's tolerance of                            previous anesthesia was also reviewed. The risks                            and benefits of the procedure and the sedation                            options and risks were discussed with the patient.                            All questions were answered, and informed consent                            was obtained. Prior Anticoagulants: The patient has                            taken no previous anticoagulant or antiplatelet                            agents. ASA Grade Assessment: II - A patient with                            mild systemic disease. After reviewing the risks                            and benefits, the patient was deemed in                            satisfactory condition to undergo the procedure.  After obtaining informed consent, the colonoscope                            was passed under direct vision. Throughout the                            procedure, the patient's blood pressure, pulse, and                            oxygen saturations were monitored continuously. The                            Olympus PCF-H190DL [id number]) Colonoscope was                            introduced through the anus and  advanced to the                            cecum, identified by appendiceal orifice and                            ileocecal valve. The colonoscopy was performed                            without difficulty. The patient tolerated the                            procedure well. The quality of the bowel                            preparation was good. The ileocecal valve,                            appendiceal orifice, and rectum were photographed. Scope In: 12:03:31 PM Scope Out: 12:19:02 PM Scope Withdrawal Time: 0 hours 12 minutes 44 seconds  Total Procedure Duration: 0 hours 15 minutes 31 seconds  Findings:                 The digital rectal exam was normal.                           The entire examined colon appeared normal on direct                            and retroflexion views. Complications:            No immediate complications. Estimated Blood Loss:     Estimated blood loss: none. Impression:               - The entire examined colon is normal on direct and                            retroflexion views.                           - No specimens collected. Recommendation:           -  Patient has a contact number available for                            emergencies. The signs and symptoms of potential                            delayed complications were discussed with the                            patient. Return to normal activities tomorrow.                            Written discharge instructions were provided to the                            patient.                           - Resume previous diet.                           - Continue present medications.                           - Repeat colonoscopy in 10 years for screening                            purposes. Beverley Fiedler, MD 04/15/2021 12:21:47 PM This report has been signed electronically.

## 2021-04-15 NOTE — Progress Notes (Signed)
Pt. Reports no change in her medical or surgical history since her pre-visit 03/27/2021.

## 2021-04-15 NOTE — Progress Notes (Signed)
Pt. Wants Dr. Rhea Belton to know she has had intermittent discomfort that escalates at times to pain, on both the RUQ and LUQ of abdomen, intensifying on in the LUQ when she lays on that side. Pt. Reports sporadic diarrhea and constipation. Denies N&V.  No pain presently at time of admission.

## 2021-04-17 ENCOUNTER — Telehealth: Payer: Self-pay

## 2021-04-17 ENCOUNTER — Telehealth: Payer: Self-pay | Admitting: *Deleted

## 2021-04-17 NOTE — Telephone Encounter (Signed)
  Follow up Call-  Call back number 04/15/2021  Post procedure Call Back phone  # [phone]  Permission to leave phone message Yes  Some recent data might be hidden     Patient questions:  Message left to call if  Necessary.

## 2021-04-17 NOTE — Telephone Encounter (Signed)
LVM

## 2021-05-26 ENCOUNTER — Other Ambulatory Visit: Payer: Self-pay | Admitting: Physician Assistant

## 2021-05-26 DIAGNOSIS — F410 Panic disorder [episodic paroxysmal anxiety] without agoraphobia: Secondary | ICD-10-CM

## 2021-05-27 NOTE — Telephone Encounter (Signed)
Last written 03/30/2021 #30 no refills Last appt 01/23/2020 (acute 08/12/2020)

## 2021-08-15 ENCOUNTER — Other Ambulatory Visit: Payer: Self-pay | Admitting: Physician Assistant

## 2021-08-15 DIAGNOSIS — F410 Panic disorder [episodic paroxysmal anxiety] without agoraphobia: Secondary | ICD-10-CM

## 2021-08-17 NOTE — Telephone Encounter (Signed)
Left voicemail for patient to call back to get this f/u appt scheduled for any further refills. AM

## 2021-08-17 NOTE — Telephone Encounter (Signed)
Please call patient for appt.  

## 2021-08-17 NOTE — Telephone Encounter (Signed)
Needs appt

## 2021-08-17 NOTE — Telephone Encounter (Signed)
Last written 05/27/2021 #30 no refills Last appt over a year ago.

## 2021-08-18 ENCOUNTER — Other Ambulatory Visit: Payer: Self-pay | Admitting: Physician Assistant

## 2021-08-18 DIAGNOSIS — F410 Panic disorder [episodic paroxysmal anxiety] without agoraphobia: Secondary | ICD-10-CM

## 2021-08-21 ENCOUNTER — Telehealth: Payer: Self-pay | Admitting: Physician Assistant

## 2021-08-21 DIAGNOSIS — F410 Panic disorder [episodic paroxysmal anxiety] without agoraphobia: Secondary | ICD-10-CM

## 2021-08-21 MED ORDER — ALPRAZOLAM 0.5 MG PO TABS: ORAL_TABLET | ORAL | 0 refills | Status: DC

## 2021-08-21 NOTE — Telephone Encounter (Signed)
I gave her 10 until appt.

## 2021-08-21 NOTE — Telephone Encounter (Signed)
Patient has not been seen in a year. Refill has been denied once.

## 2021-08-21 NOTE — Telephone Encounter (Signed)
Pt called. She has scheduled an appointment for August 31. She would like a refill on her Xanax enough to last until she comes in for her appointment.  Thank you.

## 2021-08-26 ENCOUNTER — Other Ambulatory Visit: Payer: Self-pay

## 2021-08-26 ENCOUNTER — Encounter: Payer: Self-pay | Admitting: Physician Assistant

## 2021-08-26 ENCOUNTER — Ambulatory Visit (INDEPENDENT_AMBULATORY_CARE_PROVIDER_SITE_OTHER): Payer: 59 | Admitting: Physician Assistant

## 2021-08-26 VITALS — BP 131/76 | HR 71 | Ht 66.0 in | Wt 166.0 lb

## 2021-08-26 DIAGNOSIS — G2581 Restless legs syndrome: Secondary | ICD-10-CM | POA: Insufficient documentation

## 2021-08-26 DIAGNOSIS — Z131 Encounter for screening for diabetes mellitus: Secondary | ICD-10-CM

## 2021-08-26 DIAGNOSIS — F410 Panic disorder [episodic paroxysmal anxiety] without agoraphobia: Secondary | ICD-10-CM

## 2021-08-26 DIAGNOSIS — Z79899 Other long term (current) drug therapy: Secondary | ICD-10-CM

## 2021-08-26 DIAGNOSIS — Z1329 Encounter for screening for other suspected endocrine disorder: Secondary | ICD-10-CM

## 2021-08-26 DIAGNOSIS — E781 Pure hyperglyceridemia: Secondary | ICD-10-CM

## 2021-08-26 DIAGNOSIS — Z0000 Encounter for general adult medical examination without abnormal findings: Secondary | ICD-10-CM | POA: Diagnosis not present

## 2021-08-26 DIAGNOSIS — Z13220 Encounter for screening for lipoid disorders: Secondary | ICD-10-CM | POA: Diagnosis not present

## 2021-08-26 MED ORDER — ALPRAZOLAM 0.5 MG PO TABS: ORAL_TABLET | ORAL | 5 refills | Status: DC

## 2021-08-26 MED ORDER — SERTRALINE HCL 50 MG PO TABS: ORAL_TABLET | ORAL | 3 refills | Status: DC

## 2021-08-26 NOTE — Patient Instructions (Signed)
Restless Legs Syndrome Restless legs syndrome is a condition that causes uncomfortable feelings or sensations in the legs, especially while sitting or lying down. The sensations usually cause an overwhelming urge to move the legs. The arms can also sometimes be affected. The condition can range from mild to severe. The symptoms often interfere with a person's ability to sleep. What are the causes? The cause of this condition is not known. What increases the risk? The following factors may make you more likely to develop this condition: Being older than 50. Pregnancy. Being a woman. In general, the condition is more common in women than in men. A family history of the condition. Having iron deficiency. Overuse of caffeine, nicotine, or alcohol. Certain medical conditions, such as kidney disease, Parkinson's disease, or nerve damage. Certain medicines, such as those for high blood pressure, nausea, colds, allergies, depression, and some heart conditions. What are the signs or symptoms? The main symptom of this condition is uncomfortable sensations in the legs, such as: Pulling. Tingling. Prickling. Throbbing. Crawling. Burning. Usually, the sensations: Affect both sides of the body. Are worse when you sit or lie down. Are worse at night. These may wake you up or make it difficult to fall asleep. Make you have a strong urge to move your legs. Are temporarily relieved by moving your legs. The arms can also be affected, but this is rare. People who have this condition often have tiredness during the day because of their lack of sleep at night. How is this diagnosed? This condition may be diagnosed based on: Your symptoms. Blood tests. In some cases, you may be monitored in a sleep lab by a specialist (a sleep study). This can detect any disruptions in your sleep. How is this treated? This condition is treated by managing the symptoms. This may include: Lifestyle changes, such as  exercising, using relaxation techniques, and avoiding caffeine, alcohol, or tobacco. Medicines. Anti-seizure medicines may be tried first. Follow these instructions at home: General instructions Take over-the-counter and prescription medicines only as told by your health care provider. Use methods to help relieve the uncomfortable sensations, such as: Massaging your legs. Walking or stretching. Taking a cold or hot bath. Keep all follow-up visits as told by your health care provider. This is important. Lifestyle   Practice good sleep habits. For example, go to bed and get up at the same time every day. Most adults should get 7-9 hours of sleep each night. Exercise regularly. Try to get at least 30 minutes of exercise most days of the week. Practice ways of relaxing, such as yoga or meditation. Avoid caffeine and alcohol. Do not use any products that contain nicotine or tobacco, such as cigarettes and e-cigarettes. If you need help quitting, ask your health care provider. Contact a health care provider if: Your symptoms get worse or they do not improve with treatment. Summary Restless legs syndrome is a condition that causes uncomfortable feelings or sensations in the legs, especially while sitting or lying down. The symptoms often interfere with a person's ability to sleep. This condition is treated by managing the symptoms. You may need to make lifestyle changes or take medicines. This information is not intended to replace advice given to you by your health care provider. Make sure you discuss any questions you have with your health care provider. Document Revised: 02/01/2020 Document Reviewed: 01/02/2018 Elsevier Patient Education  2022 Elsevier Inc.   Ropinirole Oral Tablets What is this medication? ROPINIROLE (roe PIN i role) is used  to treat symptoms of Parkinson's disease. It is also used to treat Restless Legs Syndrome. This medicine may be used for other purposes; ask your  health care provider or pharmacist if you have questions. COMMON BRAND NAME(S): Requip What should I tell my care team before I take this medication? They need to know if you have any of these conditions: heart disease high blood pressure kidney disease liver disease low blood pressure narcolepsy sleep apnea smoke tobacco cigarettes an unusual or allergic reaction to ropinirole, other medicines, foods, dyes, or preservatives pregnant or trying to get pregnant breast-feeding How should I use this medication? Take this medicine by mouth with water. Take it as directed on the prescription label. You can take it with or without food. If it upsets your stomach, take it with food. Keep taking this medicine unless your health care provider tells you to stop. Stopping it too quickly can cause serious side effects. It can also make your condition worse. Talk to your health care provider about the use of this medicine in children. Special care may be needed. Overdosage: If you think you have taken too much of this medicine contact a poison control center or emergency room at once. NOTE: This medicine is only for you. Do not share this medicine with others. What if I miss a dose? If you miss a dose, take it as soon as you can. If it is almost time for your next dose, take only that dose. Do not take double or extra doses. What may interact with this medication? alcohol antihistamines for allergy, cough and cold certain medicines for depression, anxiety, or psychotic disturbances certain medicines for seizures like phenobarbital, primidone certain medicines for sleep ciprofloxacin female hormones, like estrogens and birth control pills fluvoxamine general anesthetics like halothane, isoflurane, methoxyflurane, propofol medicines for blood pressure medicines that relax muscles for surgery metoclopramide narcotic medicines for pain rifampin tobacco smoking This list may not describe all  possible interactions. Give your health care provider a list of all the medicines, herbs, non-prescription drugs, or dietary supplements you use. Also tell them if you smoke, drink alcohol, or use illegal drugs. Some items may interact with your medicine. What should I watch for while using this medication? Visit your health care provider for regular checks on your progress. Tell your health care provider if your symptoms do not start to get better or if they get worse. Do not suddenly stop taking this medicine. You may develop a severe reaction. Your health care provider will tell you how much medicine to take. If your health care provider wants you to stop the medicine, the dose may be slowly lowered over time to avoid any side effects. You may get drowsy or dizzy. Do not drive, use machinery, or do anything that needs mental alertness until you know how this drug affects you. Do not stand or sit up quickly, especially if you are an older patient. This reduces the risk of dizzy or fainting spells. Alcohol may interfere with the effect of this medicine. Avoid alcoholic drinks. When taking this medicine, you may fall asleep without notice. You may be doing activities like driving a car, talking, or eating. You may not feel drowsy before it happens. Contact your health care provider right away if this happens to you. There have been reports of increased sexual urges or other strong urges such as gambling while taking this medicine. If you experience any of these while taking this medicine, you should report this to your  health care provider as soon as possible. Your mouth may get dry. Chewing sugarless gum or sucking hard candy and drinking plenty of water may help. Contact your health care provider if the problem does not go away or is severe. What side effects may I notice from receiving this medication? Side effects that you should report to your doctor or health care professional as soon as  possible: allergic reactions (skin rash, itching or hives; swelling of the face, lips, or tongue) changes in emotions or moods changes in vision confusion depressed mood increased blood pressure falling asleep during normal activities like driving fast, irregular heartbeat hallucinations low blood pressure (dizziness; feeling faint or lightheaded, falls; unusually weak or tired) new or increased gambling urges, sexual urges, uncontrolled spending, binge or compulsive eating, or other urges uncontrollable head, mouth, neck, arm, or leg movements Side effects that usually do not require medical attention (report to your doctor or health care professional if they continue or are bothersome): constipation dizziness drowsiness dry mouth headache nausea This list may not describe all possible side effects. Call your doctor for medical advice about side effects. You may report side effects to FDA at 1-800-FDA-1088. Where should I keep my medication? Keep out of the reach of children and pets. Store at room temperature between 20 and 25 degrees C (68 and 77 degrees F). Protect from light and moisture. Keep the container tightly closed. Get rid of any unused medicine after the expiration date. To get rid of medicines that are no longer needed or have expired: Take the medicine to a medicine take-back program. Check with your pharmacy or law enforcement to find a location. If you cannot return the medicine, check the label or package insert to see if the medicine should be thrown out in the garbage or flushed down the toilet. If you are not sure, ask your health care provider. If it is safe to put it in the trash, take the medicine out of the container. Mix the medicine with cat litter, dirt, coffee grounds, or other unwanted substance. Seal the mixture in a bag or container. Put it in the trash. NOTE: This sheet is a summary. It may not cover all possible information. If you have questions about  this medicine, talk to your doctor, pharmacist, or health care provider.  2022 Elsevier/Gold Standard (2020-08-05 20:54:24)

## 2021-08-26 NOTE — Progress Notes (Signed)
Subjective:    Patient ID: Linda Robertson, female    DOB: Jun 29, 1971, 50 y.o.   MRN: [id number]  HPI Pt is a 50 yo female with panic disorder who presents to the clinic for medication refills and annual physical.   Mood doing great. She is having RLS symptoms at bedtime. Her mother has this. She does not want to be on medication. It is not every night.   .. Active Ambulatory Problems    Diagnosis Date Noted   Right shoulder pain 11/29/2011   Left ankle injury 12/06/2011   Panic disorder 08/16/2014   Post-menopausal 03/16/2016   Vitamin D insufficiency 03/16/2016   Insomnia 03/16/2016   Dizziness 03/16/2016   No energy 03/16/2016   Hot flashes 03/16/2016   Heel pain, bilateral 06/01/2016   Chest pain 07/14/2017   Atypical chest pain 08/11/2017   Left upper quadrant pain 01/23/2020   Constipation 01/23/2020   Bloating 01/23/2020   Abnormal weight gain 01/23/2020   Hyperlipidemia 01/24/2020   Hypertriglyceridemia 01/24/2020   COVID-19 virus infection 07/30/2020   SOB (shortness of breath) 08/12/2020   Acute respiratory disease due to COVID-19 virus 08/12/2020   Elevated liver enzymes 08/12/2020   Hyperkalemia 08/12/2020   RLS (restless legs syndrome) 08/26/2021   Resolved Ambulatory Problems    Diagnosis Date Noted   No Resolved Ambulatory Problems   Past Medical History:  Diagnosis Date   Anxiety      Review of Systems  All other systems reviewed and are negative.     Objective:   Physical Exam Vitals reviewed.  Constitutional:      Appearance: Normal appearance.  HENT:     Head: Normocephalic.     Right Ear: Tympanic membrane normal.     Left Ear: Tympanic membrane normal.     Nose: Nose normal.     Mouth/Throat:     Mouth: Mucous membranes are moist.  Eyes:     Conjunctiva/sclera: Conjunctivae normal.  Cardiovascular:     Rate and Rhythm: Normal rate and regular rhythm.     Pulses: Normal pulses.     Heart sounds: Normal heart sounds.  Pulmonary:      Effort: Pulmonary effort is normal.     Breath sounds: Normal breath sounds.  Abdominal:     General: Bowel sounds are normal. There is no distension.     Palpations: Abdomen is soft. There is no mass.     Tenderness: There is no abdominal tenderness. There is no right CVA tenderness, left CVA tenderness, guarding or rebound.  Neurological:     General: No focal deficit present.     Mental Status: She is alert and oriented to person, place, and time.  Psychiatric:        Mood and Affect: Mood normal.   .. Depression screen [facility] 2/9 08/26/2021 01/23/2020 04/09/2019 09/04/2018 08/09/2017  Decreased Interest 0 0 0 0 0  Down, Depressed, Hopeless 0 0 0 0 1  PHQ - 2 Score 0 0 0 0 1  Altered sleeping 1 2 3 1 1   Tired, decreased energy 0 1 0 1 1  Change in appetite 1 2 1  0 0  Feeling bad or failure about yourself  0 0 0 0 0  Trouble concentrating 0 0 0 0 0  Moving slowly or fidgety/restless 0 0 0 0 0  Suicidal thoughts 0 0 0 0 0  PHQ-9 Score 2 5 4 2 3   Difficult doing work/chores Not difficult at all Not difficult  at all Not difficult at all Not difficult at all -   .. GAD 7 : Generalized Anxiety Score 08/26/2021 01/23/2020 04/09/2019 09/04/2018  Nervous, Anxious, on Edge 0 1 0 1  Control/stop worrying 0 0 0 1  Worry too much - different things 0 1 1 1   Trouble relaxing 1 0 0 1  Restless 0 0 0 0  Easily annoyed or irritable 0 1 0 1  Afraid - awful might happen 0 0 0 1  Total GAD 7 Score 1 3 1 6   Anxiety Difficulty Not difficult at all Not difficult at all Not difficult at all Not difficult at all            Assessment & Plan:   Hendy was seen today for follow-up.  Diagnoses and all orders for this visit:  Routine physical examination  Medication management -     TSH -     Lipid Panel w/reflex Direct LDL -     COMPLETE METABOLIC PANEL WITH GFR -     CBC with Differential/Platelet  Screening for lipid disorders -     Lipid Panel w/reflex Direct LDL  Screening for diabetes  mellitus -     COMPLETE METABOLIC PANEL WITH GFR  Thyroid disorder screen -     TSH  Panic disorder -     sertraline (ZOLOFT) 50 MG tablet; TAKE 1 AND 1/2 TABLET BY MOUTH DAILY -     ALPRAZolam (XANAX) 0.5 MG tablet; TAKE ONE TABLET BY MOUTH TWICE A DAY AS NEEDED FOR ANXIETY  RLS (restless legs syndrome)   .Marland Kitchen Discussed 150 minutes of exercise a week.  Encouraged vitamin D 1000 units and Calcium 1300mg  or 4 servings of dairy a day.  PHQ/GAD looks great. Refilled zoloft and xanax. Discussed to use sparingly and this supply usually last her a year.  Fasting labs ordered.  Needs PAP and mammogram. Colonoscopy UTD.  Declined shingles/covid/flu vaccine.   Discussed RLS. HO given.  Consider lizzy herb shop.  Magneisum 400mg  at bedtime could help as well.  Consider requip. HO given if not improving.  Cbc to be checked.

## 2021-08-27 ENCOUNTER — Other Ambulatory Visit: Payer: Self-pay | Admitting: Physician Assistant

## 2021-08-27 LAB — LIPID PANEL W/REFLEX DIRECT LDL
Cholesterol: 263 mg/dL — ABNORMAL HIGH (ref <200)
HDL: 52 mg/dL (ref >=50)
Non-HDL Cholesterol (Calc): 211 mg/dL (calc) — ABNORMAL HIGH (ref <130)
Total CHOL/HDL Ratio: 5.1 (calc) — ABNORMAL HIGH (ref <5.0)
Triglycerides: 505 mg/dL — ABNORMAL HIGH (ref <150)

## 2021-08-27 LAB — CBC WITH DIFFERENTIAL/PLATELET
Absolute Monocytes: 845 cells/uL (ref 200–950)
Basophils Absolute: 38 cells/uL (ref 0–200)
Basophils Relative: 0.6 %
Eosinophils Absolute: 301 cells/uL (ref 15–500)
Eosinophils Relative: 4.7 %
HCT: 40.3 % (ref 35.0–45.0)
Hemoglobin: 14 g/dL (ref 11.7–15.5)
Lymphs Abs: 2541 cells/uL (ref 850–3900)
MCH: 32.6 pg (ref 27.0–33.0)
MCHC: 34.7 g/dL (ref 32.0–36.0)
MCV: 93.7 fL (ref 80.0–100.0)
MPV: 10 fL (ref 7.5–12.5)
Monocytes Relative: 13.2 %
Neutro Abs: 2675 cells/uL (ref 1500–7800)
Neutrophils Relative %: 41.8 %
Platelets: 328 10*3/uL (ref 140–400)
RBC: 4.3 10*6/uL (ref 3.80–5.10)
RDW: 12.4 % (ref 11.0–15.0)
Total Lymphocyte: 39.7 %
WBC: 6.4 10*3/uL (ref 3.8–10.8)

## 2021-08-27 LAB — COMPLETE METABOLIC PANEL WITH GFR
AG Ratio: 1.8 (calc) (ref 1.0–2.5)
ALT: 28 U/L (ref 6–29)
AST: 23 U/L (ref 10–35)
Albumin: 4.7 g/dL (ref 3.6–5.1)
Alkaline phosphatase (APISO): 97 U/L (ref 37–153)
BUN: 16 mg/dL (ref 7–25)
CO2: 28 mmol/L (ref 20–32)
Calcium: 9.8 mg/dL (ref 8.6–10.4)
Chloride: 104 mmol/L (ref 98–110)
Creat: 0.86 mg/dL (ref 0.50–1.03)
Globulin: 2.6 g/dL (calc) (ref 1.9–3.7)
Glucose, Bld: 77 mg/dL (ref 65–99)
Potassium: 4.8 mmol/L (ref 3.5–5.3)
Sodium: 140 mmol/L (ref 135–146)
Total Bilirubin: 0.3 mg/dL (ref 0.2–1.2)
Total Protein: 7.3 g/dL (ref 6.1–8.1)
eGFR: 82 mL/min/{1.73_m2} (ref >=60)

## 2021-08-27 LAB — DIRECT LDL: Direct LDL: 156 mg/dL — ABNORMAL HIGH (ref <100)

## 2021-08-27 LAB — TSH: TSH: 1.71 mIU/L

## 2021-08-27 MED ORDER — ICOSAPENT ETHYL 1 G PO CAPS: 2.0000 g | ORAL_CAPSULE | Freq: Two times a day (BID) | ORAL | 11 refills | Status: DC

## 2021-08-27 NOTE — Progress Notes (Signed)
Linda Robertson,   Thyroid looks great.  Glucose, kidney, liver looks great.  No anemia.  Your triglycerides are really elevated. We are sending for calculation of LDL.  You need to be on prescription fish oil tablets. Will send vascepa. Recheck in 6 months.  Watch sugars, carbs, processed, fatty foods.

## 2021-08-27 NOTE — Progress Notes (Signed)
Direct LDL, bad cholesterol, is 156. Goal is under 100. I do think consideration of a cholesterol lowering drug should be considered. If you want to just try vascepa and diet changes we can see where we are at in 6 months.

## 2021-08-28 ENCOUNTER — Telehealth: Payer: Self-pay | Admitting: Neurology

## 2021-08-28 NOTE — Telephone Encounter (Signed)
Prior Authorization for Icosapent Ethyl 1GM capsules submitted via covermymeds. Awaiting response.  

## 2021-09-03 NOTE — Telephone Encounter (Signed)
Medication: icosapent Ethyl (VASCEPA) 1 g capsule Prior authorization determination received Medication has been denied Reason for denial: The request for coverage for Icosapent Cap 1gm, use as directed (120 per month), is denied. This decision is based on health plan criteria for Icosapent Ethyl. This medicine is covered only if: All of the following: (1) You currently have or are considered high or very high risk for cardiovascular disease as evidenced by one of the following: (A) Established cardiovascular disease confirmed by one of the following: Acute coronary syndrome, history of myocardial infarction, stable or unstable angina, coronary or other arterial revascularization, stroke, transient ischemic attack, or peripheral arterial disease. (B) All of the following: (I) You have a diagnosis of type 2 diabetes. (II) You have 2 of the following risk factors for developing cardiovascular disease: Men greater than or equal to 55 years and women greater than or equal to 65 years, cigarette smoker or stopped smoking within the past 3 months, hypertension (pretreatment blood pressure greater than or equal to systolic or greater than or equal to diastolic), high-density lipoprotein cholesterol less than or equal to 40mg /dL for men or less than or equal to 50mg /dL for women, high-sensitivity Creactive protein greater than 3.0mg /L, creatinine clearance greater than 30 and less than 69ml/min, retinopathy, micro- or macro-albuminuria, ankle-brachial index less than 0.9 without symptoms of intermittent claudication. (2) Your doctor submits medical records (for example, chart notes, laboratory values) documenting one of the following: (A) You have been receiving at least 12 consecutive weeks of high-intensity statin therapy (that is, atorvastatin 40-80mg , rosuvastatin 20-40mg ) and will continue to receive a high-intensity statin at maximally tolerated dose. (B) Both of the  following: (I) You are unable to tolerate high-intensity statins as evidenced by one of the following intolerable and persistent (that is, more than two weeks) symptoms: (a) Myalgia (muscle symptoms without CK elevations). (b) Myositis (muscle symptoms with CK elevations less than ten times upper limit of normal). (II) One of the following: (a) You have been receiving at least 12 consecutive weeks of moderate-intensity (that is, atorvastatin 10-20mg , rosuvastatin 5-10mg , simvastatin greater than or equal to 20mg , pravastatin greater than or equal to 40mg , lovastatin 40mg , fluvastatin XL 80mg , fluvastatin 40mg  twice daily or Livalo greater than or equal to 2mg ) statin therapy and will continue to receive a moderate-intensity statin at a maximally tolerated dose. (b) You have been receiving at least 12 consecutive weeks of low-intensity (that is, simvastatin 10mg , pravastatin 10-20mg , lovastatin 20mg , fluvastatin 20-40mg , or Livalo 1mg ) statin therapy and will continue to receive a low-intensity statin at a maximally tolerated dose. (3) Your doctor submits medical records (for example, chart notes, laboratory values) documenting one of the following: (A) You have been receiving at least 12 consecutive weeks of ezetimibe therapy as adjunct to a maximally tolerated statin therapy. (B) You have a history of contraindication or intolerance to ezetimibe. (C) You have a low density lipoprotein cholesterol less than 100mg /dL while on a maximally tolerated statin therapy. (4) The medication is used as an adjunct to a low-fat diet and exercise. (5) The medication is prescribed by or in consultation with one of the following: Cardiologist, endocrinologist, or lipid specialist. (6) Your doctor attests to the following: The information provided is true and accurate to the best of their knowledge and they understand that [employer] may perform a routine audit and request the medical information  necessary to verify the accuracy of the information provided. The information provided does not show that you meet the criteria  listed above.

## 2021-09-07 MED ORDER — OMEGA-3-ACID ETHYL ESTERS 1 G PO CAPS: 2.0000 g | ORAL_CAPSULE | Freq: Two times a day (BID) | ORAL | 3 refills | Status: DC

## 2021-09-07 NOTE — Addendum Note (Signed)
Addended by: Jomarie Longs on: 09/07/2021 12:13 PM   Modules accepted: Orders

## 2021-10-27 ENCOUNTER — Encounter: Payer: Self-pay | Admitting: Physician Assistant

## 2021-10-27 ENCOUNTER — Telehealth (INDEPENDENT_AMBULATORY_CARE_PROVIDER_SITE_OTHER): Payer: 59 | Admitting: Physician Assistant

## 2021-10-27 VITALS — Temp 98.6°F | Ht 66.0 in | Wt 155.0 lb

## 2021-10-27 DIAGNOSIS — U071 COVID-19: Secondary | ICD-10-CM

## 2021-10-27 DIAGNOSIS — R051 Acute cough: Secondary | ICD-10-CM | POA: Diagnosis not present

## 2021-10-27 MED ORDER — NIRMATRELVIR/RITONAVIR (PAXLOVID)TABLET: 3.0000 | ORAL_TABLET | Freq: Two times a day (BID) | ORAL | 0 refills | Status: AC

## 2021-10-27 MED ORDER — BENZONATATE 200 MG PO CAPS: 200.0000 mg | ORAL_CAPSULE | Freq: Three times a day (TID) | ORAL | 0 refills | Status: DC | PRN

## 2021-10-27 MED ORDER — HYDROCOD POLST-CPM POLST ER 10-8 MG/5ML PO SUER: 5.0000 mL | Freq: Two times a day (BID) | ORAL | 0 refills | Status: DC | PRN

## 2021-10-27 NOTE — Progress Notes (Signed)
Patient ID: Linda Robertson, female   DOB: 1971-04-26, 50 y.o.   MRN: [id number] .Marland KitchenVirtual Visit via Telephone Note  I connected with Linda Robertson on 10/27/21 at 10:30 AM EDT by telephone and verified that I am speaking with the correct person using two identifiers.  Location: Patient: home Provider: clinic  .Marland KitchenParticipating in visit:  Patient: Linda Robertson Provider: Tandy Gaw PA-C   I discussed the limitations, risks, security and privacy concerns of performing an evaluation and management service by telephone and the availability of in person appointments. I also discussed with the patient that there may be a patient responsible charge related to this service. The patient expressed understanding and agreed to proceed.   History of Present Illness: Pt is a 50 yo female who tested positive for covid today. Symptoms started Sunday, 2 days ago. Her covid test on Sunday was negative. Her covid test this morning was positive. She has not had vaccine but was hospitalized for covid in 07/2020.  She is very fatigued. She is coughing a lot and short of breath. She denies any fever. She is having chills and body aches. Her pulse ox is 97 percent. She is taking OTC sinus cold and sinus medication with little help. She has her albuterol from last year and helping some.    .. Active Ambulatory Problems    Diagnosis Date Noted   Right shoulder pain 11/29/2011   Left ankle injury 12/06/2011   Panic disorder 08/16/2014   Post-menopausal 03/16/2016   Vitamin D insufficiency 03/16/2016   Insomnia 03/16/2016   Dizziness 03/16/2016   No energy 03/16/2016   Hot flashes 03/16/2016   Heel pain, bilateral 06/01/2016   Chest pain 07/14/2017   Atypical chest pain 08/11/2017   Left upper quadrant pain 01/23/2020   Constipation 01/23/2020   Bloating 01/23/2020   Abnormal weight gain 01/23/2020   Hyperlipidemia 01/24/2020   Hypertriglyceridemia 01/24/2020   COVID-19 virus infection 07/30/2020   SOB (shortness  of breath) 08/12/2020   Acute respiratory disease due to COVID-19 virus 08/12/2020   Elevated liver enzymes 08/12/2020   Hyperkalemia 08/12/2020   RLS (restless legs syndrome) 08/26/2021   Resolved Ambulatory Problems    Diagnosis Date Noted   No Resolved Ambulatory Problems   Past Medical History:  Diagnosis Date   Anxiety     Observations/Objective: Productive cough Slightly labored breathing while talking  .Marland Kitchen Today's Vitals   10/27/21 1023  Temp: 98.6 F (37 C)  TempSrc: Oral  Weight: 155 lb (70.3 kg)  Height: 5\' 6"  (1.676 m)   Body mass index is 25.02 kg/m.    Assessment and Plan: Marland KitchenLavaya was seen today for covid positive.  Diagnoses and all orders for this visit:  COVID-19 virus infection -     nirmatrelvir/ritonavir EUA (PAXLOVID) 20 x 150 MG & 10 x 100MG  TABS; Take 3 tablets by mouth 2 (two) times daily for 5 days. (Take nirmatrelvir 150 mg two tablets twice daily for 5 days and ritonavir 100 mg one tablet twice daily for 5 days) Patient GFR is 82. -     benzonatate (TESSALON) 200 MG capsule; Take 1 capsule (200 mg total) by mouth 3 (three) times daily as needed for cough. -     chlorpheniramine-HYDROcodone (TUSSIONEX) 10-8 MG/5ML SUER; Take 5 mLs by mouth every 12 (twelve) hours as needed.  Acute cough -     benzonatate (TESSALON) 200 MG capsule; Take 1 capsule (200 mg total) by mouth 3 (three) times daily as needed for cough. -  chlorpheniramine-HYDROcodone (TUSSIONEX) 10-8 MG/5ML SUER; Take 5 mLs by mouth every 12 (twelve) hours as needed.  Pt was told to quarantine fully until Saturday and then if feeling better can return to work and public wearing a mask for 5 more days. If not feeling well consider quarantine for full 10 days.  Start antiviral.  Tessalon and tussinex given for cough.  Albuterol as needed every 2-4 hours.  Discussed good deep breaths every hour.  Start ASA 81mg  daily.  Rest and hydrate.  If pulse ox drops below 90 go to ED.  Go to  ED with worsening SOB or CP.  Go to ED with leg swelling or pain.  Follow up as needed.     Follow Up Instructions:    I discussed the assessment and treatment plan with the patient. The patient was provided an opportunity to ask questions and all were answered. The patient agreed with the plan and demonstrated an understanding of the instructions.   The patient was advised to call back or seek an in-person evaluation if the symptoms worsen or if the condition fails to improve as anticipated.  I provided 20 minutes of non-face-to-face time during this encounter.   , PA-C

## 2021-10-27 NOTE — Progress Notes (Signed)
Tested positive for Covid this AM Symptoms - Sunday (negative test) Sore throat Cough Headache Chills   Taking Advil, Tylenol, and OTC cough meds

## 2022-01-13 ENCOUNTER — Encounter: Payer: Self-pay | Admitting: *Deleted

## 2022-01-13 NOTE — Progress Notes (Unsigned)
Message left on Ms Hew's voice mail with my contact info and [employer] info.Encouraged her to call back with questions.

## 2022-04-08 ENCOUNTER — Other Ambulatory Visit: Payer: Self-pay | Admitting: Physician Assistant

## 2022-04-08 DIAGNOSIS — F410 Panic disorder [episodic paroxysmal anxiety] without agoraphobia: Secondary | ICD-10-CM

## 2022-04-08 NOTE — Telephone Encounter (Signed)
Please call patient for appt.  

## 2022-04-08 NOTE — Telephone Encounter (Signed)
Last written 08/26/2021 #60 with 5 refills  ?Last appt 08/26/2021, acute visit in November ?

## 2022-04-08 NOTE — Telephone Encounter (Signed)
Patient has been scheduled for 04/23/22. ?

## 2022-04-08 NOTE — Telephone Encounter (Signed)
Well past overdue for 62-month follow-up.  2 weeks worth sent to pharmacy needs to schedule follow-up appointment for chronic benzodiazepine use. ?

## 2022-04-20 ENCOUNTER — Telehealth: Payer: Self-pay | Admitting: Physician Assistant

## 2022-04-20 DIAGNOSIS — E785 Hyperlipidemia, unspecified: Secondary | ICD-10-CM

## 2022-04-20 DIAGNOSIS — E781 Pure hyperglyceridemia: Secondary | ICD-10-CM

## 2022-04-20 NOTE — Telephone Encounter (Signed)
Patient scheduled for an appointment on 5/5 and would like to come in early for blood work. Please advise.  ?

## 2022-04-21 NOTE — Telephone Encounter (Signed)
Patient made aware labs ordered.  

## 2022-04-21 NOTE — Telephone Encounter (Signed)
Labs were done in August, only one that looks like it needs redone is Lipid? Can you review?  ?

## 2022-04-23 ENCOUNTER — Ambulatory Visit: Payer: Self-pay | Admitting: Physician Assistant

## 2022-04-30 ENCOUNTER — Ambulatory Visit (INDEPENDENT_AMBULATORY_CARE_PROVIDER_SITE_OTHER): Payer: Self-pay | Admitting: Physician Assistant

## 2022-04-30 ENCOUNTER — Encounter: Payer: Self-pay | Admitting: Physician Assistant

## 2022-04-30 VITALS — BP 135/62 | HR 52 | Ht 66.0 in | Wt 168.1 lb

## 2022-04-30 DIAGNOSIS — E781 Pure hyperglyceridemia: Secondary | ICD-10-CM

## 2022-04-30 DIAGNOSIS — E785 Hyperlipidemia, unspecified: Secondary | ICD-10-CM

## 2022-04-30 DIAGNOSIS — F410 Panic disorder [episodic paroxysmal anxiety] without agoraphobia: Secondary | ICD-10-CM

## 2022-04-30 MED ORDER — ALPRAZOLAM 0.5 MG PO TABS: ORAL_TABLET | ORAL | 5 refills | Status: DC

## 2022-04-30 NOTE — Progress Notes (Signed)
? ?Established Patient Office Visit ? ?Subjective   ?Patient ID: Linda Robertson, female    DOB: [birth year]  Age: 51 y.o. MRN: [id number] ? ?Chief Complaint  ?Patient presents with  ? Follow-up  ? ? ?HPI ?Pt is a 51 yo female with panic disorder who needs refills on xanax.  ? ?She is doing well on zoloft and as needed xanax. No concerns. No SI/HC.  ? ?She is working on diet and exercise for cholesterol and TG. She had checked this morning.  ? ?Patient Active Problem List  ? Diagnosis Date Noted  ? RLS (restless legs syndrome) 08/26/2021  ? SOB (shortness of breath) 08/12/2020  ? Acute respiratory disease due to COVID-19 virus 08/12/2020  ? Elevated liver enzymes 08/12/2020  ? Hyperkalemia 08/12/2020  ? COVID-19 virus infection 07/30/2020  ? Hyperlipidemia 01/24/2020  ? Hypertriglyceridemia 01/24/2020  ? Left upper quadrant pain 01/23/2020  ? Constipation 01/23/2020  ? Bloating 01/23/2020  ? Abnormal weight gain 01/23/2020  ? Atypical chest pain 08/11/2017  ? Chest pain 07/14/2017  ? Heel pain, bilateral 06/01/2016  ? Post-menopausal 03/16/2016  ? Vitamin D insufficiency 03/16/2016  ? Insomnia 03/16/2016  ? Dizziness 03/16/2016  ? No energy 03/16/2016  ? Hot flashes 03/16/2016  ? Panic disorder 08/16/2014  ? Left ankle injury 12/06/2011  ? Right shoulder pain 11/29/2011  ? ?Allergies  ?Allergen Reactions  ? Codeine Itching  ? ?  ? ?Review of Systems  ?All other systems reviewed and are negative. ? ?  ?Objective:  ?  ? ?BP 135/62   Pulse (!) 52   Ht 5\' 6"  (1.676 m)   Wt 168 lb 1.6 oz (76.2 kg)   LMP 10/10/2016   SpO2 99%   BMI 27.13 kg/m?  ?BP Readings from Last 3 Encounters:  ?04/30/22 135/62  ?08/26/21 131/76  ?04/15/21 117/71  ? ?  ? ?Physical Exam ?Vitals reviewed.  ?Constitutional:   ?   Appearance: Normal appearance.  ?HENT:  ?   Head: Normocephalic.  ?Cardiovascular:  ?   Rate and Rhythm: Normal rate and regular rhythm.  ?   Pulses: Normal pulses.  ?   Heart sounds: Normal heart sounds.  ?Pulmonary:  ?    Effort: Pulmonary effort is normal.  ?   Breath sounds: Normal breath sounds.  ?Neurological:  ?   General: No focal deficit present.  ?   Mental Status: She is alert and oriented to person, place, and time.  ?Psychiatric:     ?   Mood and Affect: Mood normal.  ? ?.. ? ?  04/30/2022  ?  9:58 AM 08/26/2021  ?  8:43 AM 01/23/2020  ?  8:12 AM 04/09/2019  ? 11:18 AM 09/04/2018  ? 10:21 AM  ?Depression screen PHQ 2/9  ?Decreased Interest 0 0 0 0 0  ?Down, Depressed, Hopeless 0 0 0 0 0  ?PHQ - 2 Score 0 0 0 0 0  ?Altered sleeping 0 1 2 3 1   ?Tired, decreased energy 0 0 1 0 1  ?Change in appetite 0 1 2 1  0  ?Feeling bad or failure about yourself  0 0 0 0 0  ?Trouble concentrating 0 0 0 0 0  ?Moving slowly or fidgety/restless 0 0 0 0 0  ?Suicidal thoughts 0 0 0 0 0  ?PHQ-9 Score 0 2 5 4 2   ?Difficult doing work/chores  Not difficult at all Not difficult at all Not difficult at all Not difficult at all  ? ?.. ? ?  04/30/2022  ?  9:59 AM 08/26/2021  ?  8:43 AM 01/23/2020  ?  8:12 AM 04/09/2019  ? 11:20 AM  ?GAD 7 : Generalized Anxiety Score  ?Nervous, Anxious, on Edge 0 0 1 0  ?Control/stop worrying 0 0 0 0  ?Worry too much - different things 0 0 1 1  ?Trouble relaxing 0 1 0 0  ?Restless 0 0 0 0  ?Easily annoyed or irritable 0 0 1 0  ?Afraid - awful might happen 0 0 0 0  ?Total GAD 7 Score 0 1 3 1   ?Anxiety Difficulty  Not difficult at all Not difficult at all Not difficult at all  ? ? ? ? ? ? ?  ?Assessment & Plan:  ?..Mayar was seen today for follow-up. ? ?Diagnoses and all orders for this visit: ? ?Panic disorder ?-     ALPRAZolam (XANAX) 0.5 MG tablet; TAKE ONE TABLET BY MOUTH TWICE A DAY AS NEEDED FOR ANXIETY ? ?Hypertriglyceridemia ? ?Hyperlipidemia, unspecified hyperlipidemia type ? ? ?PHQ/GAD looks great ?On zoloft ?Refilled xanax ? ?Will call with lipid results ?Continue on lovaza ?Continue with diet and exercise ? ? ? ?Return in about 6 months (around 10/31/2022).  ? ? ?13/04/2022, PA-C ? ?

## 2022-05-01 LAB — LIPID PANEL W/REFLEX DIRECT LDL
Cholesterol: 256 mg/dL — ABNORMAL HIGH (ref <200)
HDL: 54 mg/dL (ref >=50)
LDL Cholesterol (Calc): 159 mg/dL (calc) — ABNORMAL HIGH
Non-HDL Cholesterol (Calc): 202 mg/dL (calc) — ABNORMAL HIGH (ref <130)
Total CHOL/HDL Ratio: 4.7 (calc) (ref <5.0)
Triglycerides: 262 mg/dL — ABNORMAL HIGH (ref <150)

## 2022-05-03 NOTE — Progress Notes (Signed)
...  The 10-year ASCVD risk score (Arnett DK, et al., 2019) is: 2.1% ?  Values used to calculate the score: ?    Age: 51 years ?    Sex: Female ?    Is Non-Hispanic African American: No ?    Diabetic: No ?    Tobacco smoker: No ?    Systolic Blood Pressure: 135 mmHg ?    Is BP treated: No ?    HDL Cholesterol: 54 mg/dL ?    Total Cholesterol: 256 mg/dL ? ?Your overall CV risk is still low at 2.1 percent.  ?Your TG did improve but not to goal.  ?Your HDL stayed looking good.  ?Your LDL is still elevated at 159.  ?It would still be reasonable to start a low dose statin to lower both TG and LDL to get you closer to goal or we could see what 6 months of diet/exercise/lovaza does.

## 2022-07-13 ENCOUNTER — Telehealth: Payer: Self-pay

## 2022-07-13 NOTE — Telephone Encounter (Signed)
Patient left a vm msg for provider. She wants to know if she is a good candidate for Ozempic? Patient would like it for weight loss. Please advise, thanks.

## 2022-07-14 NOTE — Telephone Encounter (Signed)
Task completed. Patient informed of provider's recommendation. Patient is interested in knowing more about the types of weight loss medications that are available. She will call the clinic to set up an appointment - she did not have her personal calendar on hand for scheduling. No other inquiries during the call.

## 2022-07-28 ENCOUNTER — Encounter: Payer: Self-pay | Admitting: Neurology

## 2022-11-23 ENCOUNTER — Other Ambulatory Visit: Payer: Self-pay | Admitting: Physician Assistant

## 2022-11-23 DIAGNOSIS — F410 Panic disorder [episodic paroxysmal anxiety] without agoraphobia: Secondary | ICD-10-CM

## 2022-11-23 NOTE — Telephone Encounter (Signed)
Last written 04/30/2022 #30 with 5 refills   Last appt 04/30/2022, no follow up on file.   Please call patient to schedule appt

## 2022-11-24 NOTE — Telephone Encounter (Signed)
Lvm for patient to call and schedule a appointment for med refills with [facility]. tvt

## 2022-11-24 NOTE — Telephone Encounter (Signed)
They have reached out to patient to schedule an appt, but has not scheduled yet. Please advise on refill.

## 2022-12-15 ENCOUNTER — Telehealth: Payer: Self-pay | Admitting: Family Medicine

## 2022-12-15 ENCOUNTER — Ambulatory Visit: Payer: Self-pay | Admitting: Physician Assistant

## 2022-12-15 NOTE — Telephone Encounter (Signed)
Please call patient and remind her to schedule her mammogram and Pap smear mammograms can be done in our building downstairs and we do offer 3D.  And we can also get her scheduled with Linda Robertson for her Pap smear in January or February as well

## 2023-01-03 ENCOUNTER — Encounter: Payer: Self-pay | Admitting: Physician Assistant

## 2023-01-03 ENCOUNTER — Ambulatory Visit (INDEPENDENT_AMBULATORY_CARE_PROVIDER_SITE_OTHER): Payer: Self-pay | Admitting: Physician Assistant

## 2023-01-03 ENCOUNTER — Other Ambulatory Visit (HOSPITAL_COMMUNITY)
Admission: RE | Admit: 2023-01-03 | Discharge: 2023-01-03 | Disposition: A | Discharge status: Home / Self Care | Payer: Self-pay | Source: Ambulatory Visit | Attending: Physician Assistant | Admitting: Physician Assistant

## 2023-01-03 VITALS — BP 135/55 | HR 56 | Ht 66.0 in | Wt 169.0 lb

## 2023-01-03 DIAGNOSIS — Z1231 Encounter for screening mammogram for malignant neoplasm of breast: Secondary | ICD-10-CM

## 2023-01-03 DIAGNOSIS — E785 Hyperlipidemia, unspecified: Secondary | ICD-10-CM

## 2023-01-03 DIAGNOSIS — Z1329 Encounter for screening for other suspected endocrine disorder: Secondary | ICD-10-CM

## 2023-01-03 DIAGNOSIS — F410 Panic disorder [episodic paroxysmal anxiety] without agoraphobia: Secondary | ICD-10-CM

## 2023-01-03 DIAGNOSIS — Z131 Encounter for screening for diabetes mellitus: Secondary | ICD-10-CM

## 2023-01-03 DIAGNOSIS — Z124 Encounter for screening for malignant neoplasm of cervix: Secondary | ICD-10-CM | POA: Insufficient documentation

## 2023-01-03 DIAGNOSIS — G479 Sleep disorder, unspecified: Secondary | ICD-10-CM

## 2023-01-03 DIAGNOSIS — E663 Overweight: Secondary | ICD-10-CM

## 2023-01-03 DIAGNOSIS — Z79899 Other long term (current) drug therapy: Secondary | ICD-10-CM

## 2023-01-03 MED ORDER — TRAZODONE HCL 50 MG PO TABS: 25.0000 mg | ORAL_TABLET | Freq: Every evening | ORAL | 1 refills | Status: AC | PRN

## 2023-01-03 MED ORDER — SERTRALINE HCL 50 MG PO TABS: ORAL_TABLET | ORAL | 3 refills | Status: DC

## 2023-01-03 MED ORDER — ALPRAZOLAM 0.5 MG PO TABS: 0.5000 mg | ORAL_TABLET | Freq: Every day | ORAL | 5 refills | Status: DC | PRN

## 2023-01-03 MED ORDER — BUPROPION HCL ER (SR) 100 MG PO TB12: 100.0000 mg | ORAL_TABLET | Freq: Two times a day (BID) | ORAL | 2 refills | Status: DC

## 2023-01-03 NOTE — Progress Notes (Signed)
Established Patient Office Visit  Subjective   Patient ID: Linda Robertson, female    DOB: 08-26-1971  Age: 52 y.o. MRN: [id number]  Chief Complaint  Patient presents with   Follow-up    HPI Pt is a 52 yo overweight post menopausal female with panic disorder, HLD, Hypertriglyceridemia,   No hx of abnormal paps in past. No current problems with vaginal health. She is overdue for pap.   Mood and anxiety controlled with current medications and dosing. She is having some trouble sleeping. She has used trazodone in the past. She would like to restart this.   She is frustrated with her weight. She exercises and eats great but continues to be overweight. She does admit to a lot of cravings. She wonders if there was something she can take to help with this. She does not tolerate stimulant for appetite suppressant very well.   .. Active Ambulatory Problems    Diagnosis Date Noted   Right shoulder pain 11/29/2011   Left ankle injury 12/06/2011   Panic disorder 08/16/2014   Post-menopausal 03/16/2016   Vitamin D insufficiency 03/16/2016   Insomnia 03/16/2016   No energy 03/16/2016   Hot flashes 03/16/2016   Heel pain, bilateral 06/01/2016   Chest pain 07/14/2017   Atypical chest pain 08/11/2017   Left upper quadrant pain 01/23/2020   Constipation 01/23/2020   Bloating 01/23/2020   Abnormal weight gain 01/23/2020   Hyperlipidemia 01/24/2020   Hypertriglyceridemia 01/24/2020   COVID-19 virus infection 07/30/2020   SOB (shortness of breath) 08/12/2020   Acute respiratory disease due to COVID-19 virus 08/12/2020   Elevated liver enzymes 08/12/2020   Hyperkalemia 08/12/2020   RLS (restless legs syndrome) 08/26/2021   Overweight (BMI 25.0-29.9) 01/03/2023   Resolved Ambulatory Problems    Diagnosis Date Noted   Dizziness 03/16/2016   Past Medical History:  Diagnosis Date   Anxiety     Review of Systems  All other systems reviewed and are negative.     Objective:     BP  (!) 135/55   Pulse (!) 56   Ht 5\' 6"  (1.676 m)   Wt 169 lb (76.7 kg)   LMP 10/10/2016   SpO2 100%   BMI 27.28 kg/m  BP Readings from Last 3 Encounters:  01/03/23 (!) 135/55  04/30/22 135/62  08/26/21 131/76   Wt Readings from Last 3 Encounters:  01/03/23 169 lb (76.7 kg)  04/30/22 168 lb 1.6 oz (76.2 kg)  10/27/21 155 lb (70.3 kg)    ..    01/03/2023    9:50 AM 04/30/2022    9:58 AM 08/26/2021    8:43 AM 01/23/2020    8:12 AM 04/09/2019   11:18 AM  Depression screen PHQ 2/9  Decreased Interest 0 0 0 0 0  Down, Depressed, Hopeless 0 0 0 0 0  PHQ - 2 Score 0 0 0 0 0  Altered sleeping 1 0 1 2 3   Tired, decreased energy 0 0 0 1 0  Change in appetite 3 0 1 2 1   Feeling bad or failure about yourself  0 0 0 0 0  Trouble concentrating 0 0 0 0 0  Moving slowly or fidgety/restless 0 0 0 0 0  Suicidal thoughts 0 0 0 0 0  PHQ-9 Score 4 0 2 5 4   Difficult doing work/chores Not difficult at all  Not difficult at all Not difficult at all Not difficult at all   ..    01/03/2023  9:51 AM 04/30/2022    9:59 AM 08/26/2021    8:43 AM 01/23/2020    8:12 AM  GAD 7 : Generalized Anxiety Score  Nervous, Anxious, on Edge 0 0 0 1  Control/stop worrying 0 0 0 0  Worry too much - different things 0 0 0 1  Trouble relaxing 0 0 1 0  Restless 0 0 0 0  Easily annoyed or irritable 0 0 0 1  Afraid - awful might happen 0 0 0 0  Total GAD 7 Score 0 0 1 3  Anxiety Difficulty Not difficult at all  Not difficult at all Not difficult at all       Physical Exam Constitutional:      Appearance: Normal appearance.  HENT:     Head: Normocephalic.     Right Ear: Tympanic membrane normal.     Left Ear: Tympanic membrane normal.     Nose: Nose normal.     Mouth/Throat:     Mouth: Mucous membranes are moist.     Pharynx: No oropharyngeal exudate or posterior oropharyngeal erythema.  Eyes:     Extraocular Movements: Extraocular movements intact.     Conjunctiva/sclera: Conjunctivae normal.      Pupils: Pupils are equal, round, and reactive to light.  Neck:     Vascular: No carotid bruit.  Cardiovascular:     Rate and Rhythm: Normal rate and regular rhythm.     Pulses: Normal pulses.     Heart sounds: Normal heart sounds.  Pulmonary:     Effort: Pulmonary effort is normal.     Breath sounds: Normal breath sounds.  Abdominal:     General: Bowel sounds are normal. There is no distension.     Palpations: Abdomen is soft. There is no mass.     Tenderness: There is no abdominal tenderness. There is no right CVA tenderness, left CVA tenderness, guarding or rebound.     Hernia: No hernia is present.  Genitourinary:    General: Normal vulva.     Vagina: No vaginal discharge.     Rectum: Normal.  Musculoskeletal:     Cervical back: Normal range of motion and neck supple. No rigidity or tenderness.     Right lower leg: No edema.     Left lower leg: No edema.  Lymphadenopathy:     Cervical: No cervical adenopathy.  Skin:    General: Skin is warm.     Findings: No erythema or rash.  Neurological:     General: No focal deficit present.     Mental Status: She is alert and oriented to person, place, and time.  Psychiatric:        Mood and Affect: Mood normal.       Assessment & Plan:  Marland KitchenMarland KitchenHatley was seen today for follow-up.  Diagnoses and all orders for this visit:  Papanicolaou smear -     Cytology - PAP  Hyperlipidemia, unspecified hyperlipidemia type -     Lipid Panel w/reflex Direct LDL  Thyroid disorder screen -     TSH  Screening for diabetes mellitus -     COMPLETE METABOLIC PANEL WITH GFR  Medication management -     TSH -     Lipid Panel w/reflex Direct LDL -     COMPLETE METABOLIC PANEL WITH GFR -     CBC with Differential/Platelet  Panic disorder -     ALPRAZolam (XANAX) 0.5 MG tablet; Take 1 tablet (0.5 mg total) by mouth daily as  needed for anxiety. -     sertraline (ZOLOFT) 50 MG tablet; TAKE 1 AND 1/2 TABLET BY MOUTH DAILY  Encounter for  screening mammogram for malignant neoplasm of breast -     MM 3D SCREEN BREAST BILATERAL  Overweight (BMI 25.0-29.9) -     buPROPion ER (WELLBUTRIN SR) 100 MG 12 hr tablet; Take 1 tablet (100 mg total) by mouth 2 (two) times daily.  Trouble in sleeping -     traZODone (DESYREL) 50 MG tablet; Take 0.5-1 tablets (25-50 mg total) by mouth at bedtime as needed for sleep.   Fasting labs ordered Pap done today Mammogram ordered Restart trazodone for sleep .Marland KitchenDiscussed low carb diet with 1500 calories and 80g of protein.  Exercising at least 150 minutes a week.  [employer] could be a Chief Technology Officer.  Start wellbutrin Discussed side effects and other options Xanax as needed for anxiety  Refilled daily zoloft Follow up in 6 months    Tandy Gaw, PA-C

## 2023-01-03 NOTE — Patient Instructions (Addendum)
Contrave  Wegovy Zepbound  Health Maintenance, Female Adopting a healthy lifestyle and getting preventive care are important in promoting health and wellness. Ask your health care provider about: The right schedule for you to have regular tests and exams. Things you can do on your own to prevent diseases and keep yourself healthy. What should I know about diet, weight, and exercise? Eat a healthy diet  Eat a diet that includes plenty of vegetables, fruits, low-fat dairy products, and lean protein. Do not eat a lot of foods that are high in solid fats, added sugars, or sodium. Maintain a healthy weight Body mass index (BMI) is used to identify weight problems. It estimates body fat based on height and weight. Your health care provider can help determine your BMI and help you achieve or maintain a healthy weight. Get regular exercise Get regular exercise. This is one of the most important things you can do for your health. Most adults should: Exercise for at least 150 minutes each week. The exercise should increase your heart rate and make you sweat (moderate-intensity exercise). Do strengthening exercises at least twice a week. This is in addition to the moderate-intensity exercise. Spend less time sitting. Even light physical activity can be beneficial. Watch cholesterol and blood lipids Have your blood tested for lipids and cholesterol at 54 years of age, then have this test every 5 years. Have your cholesterol levels checked more often if: Your lipid or cholesterol levels are high. You are older than 53 years of age. You are at high risk for heart disease. What should I know about cancer screening? Depending on your health history and family history, you may need to have cancer screening at various ages. This may include screening for: Breast cancer. Cervical cancer. Colorectal cancer. Skin cancer. Lung cancer. What should I know about heart disease, diabetes, and high blood  pressure? Blood pressure and heart disease High blood pressure causes heart disease and increases the risk of stroke. This is more likely to develop in people who have high blood pressure readings or are overweight. Have your blood pressure checked: Every 3-5 years if you are 32-29 years of age. Every year if you are 61 years old or older. Diabetes Have regular diabetes screenings. This checks your fasting blood sugar level. Have the screening done: Once every three years after age 108 if you are at a normal weight and have a low risk for diabetes. More often and at a younger age if you are overweight or have a high risk for diabetes. What should I know about preventing infection? Hepatitis B If you have a higher risk for hepatitis B, you should be screened for this virus. Talk with your health care provider to find out if you are at risk for hepatitis B infection. Hepatitis C Testing is recommended for: Everyone born from [birth year] through 1966. Anyone with known risk factors for hepatitis C. Sexually transmitted infections (STIs) Get screened for STIs, including gonorrhea and chlamydia, if: You are sexually active and are younger than 53 years of age. You are older than 53 years of age and your health care provider tells you that you are at risk for this type of infection. Your sexual activity has changed since you were last screened, and you are at increased risk for chlamydia or gonorrhea. Ask your health care provider if you are at risk. Ask your health care provider about whether you are at high risk for HIV. Your health care provider may recommend a prescription  medicine to help prevent HIV infection. If you choose to take medicine to prevent HIV, you should first get tested for HIV. You should then be tested every 3 months for as long as you are taking the medicine. Pregnancy If you are about to stop having your period (premenopausal) and you may become pregnant, seek counseling before you  get pregnant. Take 400 to 800 micrograms (mcg) of folic acid every day if you become pregnant. Ask for birth control (contraception) if you want to prevent pregnancy. Osteoporosis and menopause Osteoporosis is a disease in which the bones lose minerals and strength with aging. This can result in bone fractures. If you are 58 years old or older, or if you are at risk for osteoporosis and fractures, ask your health care provider if you should: Be screened for bone loss. Take a calcium or vitamin D supplement to lower your risk of fractures. Be given hormone replacement therapy (HRT) to treat symptoms of menopause. Follow these instructions at home: Alcohol use Do not drink alcohol if: Your health care provider tells you not to drink. You are pregnant, may be pregnant, or are planning to become pregnant. If you drink alcohol: Limit how much you have to: 0-1 drink a day. Know how much alcohol is in your drink. In the [place] one drink equals one 12 oz bottle of beer (355 mL), one 5 oz glass of wine (148 mL), or one 1 oz glass of hard liquor (44 mL). Lifestyle Do not use any products that contain nicotine or tobacco. These products include cigarettes, chewing tobacco, and vaping devices, such as e-cigarettes. If you need help quitting, ask your health care provider. Do not use street drugs. Do not share needles. Ask your health care provider for help if you need support or information about quitting drugs. General instructions Schedule regular health, dental, and eye exams. Stay current with your vaccines. Tell your health care provider if: You often feel depressed. You have ever been abused or do not feel safe at home. Summary Adopting a healthy lifestyle and getting preventive care are important in promoting health and wellness. Follow your health care provider's instructions about healthy diet, exercising, and getting tested or screened for diseases. Follow your health care provider's  instructions on monitoring your cholesterol and blood pressure. This information is not intended to replace advice given to you by your health care provider. Make sure you discuss any questions you have with your health care provider. Document Revised: 05/04/2021 Document Reviewed: 05/04/2021 Elsevier Patient Education  [place].

## 2023-01-04 LAB — CYTOLOGY - PAP
Comment: NEGATIVE
Diagnosis: NEGATIVE
High risk HPV: NEGATIVE

## 2023-01-04 NOTE — Progress Notes (Signed)
No abnormal cells  Negative for HPV Follow up in 5 years for next pap.

## 2023-03-09 ENCOUNTER — Telehealth: Payer: Self-pay | Admitting: Neurology

## 2023-03-09 NOTE — Telephone Encounter (Signed)
Patient called and LVM stating she thinks she has an ear or sinus infection. Having congestion, dizziness, can't hear. Asking for meds to be sent in. Patient needs an appt for evaluation. Please call to schedule. [phone].

## 2023-04-19 ENCOUNTER — Ambulatory Visit: Payer: Self-pay | Admitting: Medical-Surgical

## 2023-07-05 ENCOUNTER — Other Ambulatory Visit: Payer: Self-pay | Admitting: Physician Assistant

## 2023-07-05 DIAGNOSIS — F410 Panic disorder [episodic paroxysmal anxiety] without agoraphobia: Secondary | ICD-10-CM

## 2023-07-06 ENCOUNTER — Ambulatory Visit (INDEPENDENT_AMBULATORY_CARE_PROVIDER_SITE_OTHER): Payer: Self-pay | Admitting: Physician Assistant

## 2023-07-06 ENCOUNTER — Encounter: Payer: Self-pay | Admitting: Physician Assistant

## 2023-07-06 VITALS — BP 142/53 | HR 58 | Ht 66.0 in | Wt 165.5 lb

## 2023-07-06 DIAGNOSIS — F410 Panic disorder [episodic paroxysmal anxiety] without agoraphobia: Secondary | ICD-10-CM

## 2023-07-06 DIAGNOSIS — L409 Psoriasis, unspecified: Secondary | ICD-10-CM

## 2023-07-06 DIAGNOSIS — R030 Elevated blood-pressure reading, without diagnosis of hypertension: Secondary | ICD-10-CM

## 2023-07-06 DIAGNOSIS — Z13220 Encounter for screening for lipoid disorders: Secondary | ICD-10-CM

## 2023-07-06 DIAGNOSIS — Z131 Encounter for screening for diabetes mellitus: Secondary | ICD-10-CM

## 2023-07-06 MED ORDER — ALPRAZOLAM 0.5 MG PO TABS: 0.5000 mg | ORAL_TABLET | Freq: Every day | ORAL | 5 refills | Status: DC | PRN

## 2023-07-06 MED ORDER — CLOBETASOL PROPIONATE 0.05 % EX SOLN
1.0000 | Freq: Two times a day (BID) | CUTANEOUS | 1 refills | Status: AC
Start: 2023-07-06 — End: ?

## 2023-07-06 MED ORDER — CLOBETASOL PROPIONATE 0.05 % EX CREA
1.0000 | TOPICAL_CREAM | Freq: Two times a day (BID) | CUTANEOUS | 1 refills | Status: DC
Start: 2023-07-06 — End: 2024-01-25

## 2023-07-06 NOTE — Patient Instructions (Addendum)
Will make referral to dermatology.   Apremilast Tablets What is this medication? APREMILAST (a PRE mil ast) treats arthritis and psoriasis. It may also be used to treat mouth ulcers in people with a condition that causes blood vessel swelling (Behcet syndrome). It works by decreasing inflammation. This medicine may be used for other purposes; ask your health care provider or pharmacist if you have questions. COMMON BRAND NAME(S): [place] What should I tell my care team before I take this medication? They need to know if you have any of these conditions: Dehydration Kidney disease Mental illness An unusual or allergic reaction to apremilast, other medications, foods, dyes, or preservatives Pregnant or trying to get pregnant Breast-feeding How should I use this medication? Take this medication by mouth with a glass of water. Follow the directions on the prescription label. Do not cut, crush or chew this medication. You can take it with or without food. If it upsets your stomach, take it with food. Take your medication at regular intervals. Do not take it more often than directed. Do not stop taking except on your care team's advice. Talk to your care team about the use of this medication in children. Special care may be needed. Overdosage: If you think you have taken too much of this medicine contact a poison control center or emergency room at once. NOTE: This medicine is only for you. Do not share this medicine with others. What if I miss a dose? If you miss a dose, take it as soon as you can. If it is almost time for your next dose, take only that dose. Do not take double or extra doses. What may interact with this medication? Certain medications for seizures like carbamazepine, phenobarbital, phenytoin Rifampin This list may not describe all possible interactions. Give your health care provider a list of all the medicines, herbs, non-prescription drugs, or dietary supplements you use. Also  tell them if you smoke, drink alcohol, or use illegal drugs. Some items may interact with your medicine. What should I watch for while using this medication? Tell your care team if your symptoms do not start to get better or if they get worse. Patients and their families should watch out for new or worsening depression or thoughts of suicide. Also watch out for sudden changes in feelings such as feeling anxious, agitated, panicky, irritable, hostile, aggressive, impulsive, severely restless, overly excited and hyperactive, or not being able to sleep. If this happens, call your care team. Check with your care team if you get an attack of severe diarrhea, nausea and vomiting, or if you sweat a lot. The loss of too much body fluid can make it dangerous for you to take this medication. What side effects may I notice from receiving this medication? Side effects that you should report to your care team as soon as possible: Allergic reactions--skin rash, itching, hives, swelling of the face, lips, tongue, or throat Worsening mood, feelings of depression Side effects that usually do not require medical attention (report to your care team if they continue or are bothersome): Diarrhea Headache Loss of appetite with weight loss Nausea Vomiting This list may not describe all possible side effects. Call your doctor for medical advice about side effects. You may report side effects to FDA at 1-800-FDA-1088. Where should I keep my medication? Keep out of the reach of children. Store below 30 degrees C (86 degrees F). Throw away any unused medication after the expiration date. NOTE: This sheet is a summary. It  may not cover all possible information. If you have questions about this medicine, talk to your doctor, pharmacist, or health care provider.  2024 Elsevier/Gold Standard (2021-11-05 00:00:00)  Psoriasis Psoriasis is a long-term (chronic) skin condition that causes raised, red patches (plaques) to form  on the skin. Plaques may show up anywhere on your body. They can be any size or shape. Symptoms of this condition range from mild to very severe. Psoriasis cannot be passed from one person to another (is not contagious). What are the causes? The exact cause of this condition is not known. Psoriasis is an autoimmune disease. With this type of disease, the body's defense system (immune system) mistakenly attacks healthy skin. As a result, too many skin cells form too quickly and create plaques. The disease may start due to a trigger, such as: Damage or trauma to the skin, such as cuts, scrapes, sunburn, and dryness. Not enough exposure to sunlight. Certain medicines. Alcohol or tobacco use. Stress. Infections. What increases the risk? You are more likely to develop this condition if you: Have a family history of psoriasis. Are obese. Are 63-76 years old. Are taking certain medicines. What are the signs or symptoms?  There are different types of psoriasis. You can have more than one type of psoriasis during your life. Sometimes, symptoms get worse for a period of time. These are called flares. Each type of psoriasis has different symptoms. Plaque psoriasis is the most common type. Symptoms include red, raised plaques with a silvery-white coating (scale) that are usually on the scalp, elbows, knees, and back. These plaques may be itchy. Your nails may be pitted and crumbly or fall off. Guttate psoriasis symptoms include small red spots that often show up on your trunk, arms, and legs. These spots may develop after you have been sick, especially with strep throat. Inverse psoriasis symptoms include plaques in your underarm area, under your breasts, or on your genitals, groin, or buttocks. Infection, friction, and heat may cause this type of psoriasis. Pustular psoriasis symptoms include pus-filled bumps that are painful, red, and swollen on the palms of your hands or the soles of your feet. They  can affect mobility. You also may feel fatigued, feverish, weak, or have no appetite. Erythrodermic psoriasis symptoms include bright red skin that may look burned. You may have a fast heartbeat and a body temperature that is too high or too low. You may be itchy or in pain. Sebopsoriasis symptoms include red plaques that have a greasy coating and are often on your scalp, forehead, and face. Psoriatic arthritis causes swollen, painful joints along with scaly skin plaques. Your nails may be pitted and crumbly or fall off. How is this diagnosed? This condition is diagnosed based on your symptoms, family history, and physical exam. Your health care provider may remove a tissue sample (biopsy) for testing. You may also be referred to a health care provider who specializes in skin diseases (dermatologist). How is this treated? There is no cure for this condition, but treatment can help manage it. Goals of treatment include: Helping your skin heal. Reducing itching and inflammation. Slowing the growth of new skin cells. Treating any related conditions. Psoriasis can add to your risk of developing other conditions, such as heart disease, high blood pressure, eye problems, and depression. Treatment varies depending on the severity of your condition. This condition may be treated with: Creams or ointments to help with symptoms. Ultraviolet ray exposure (light therapy or phototherapy). This may include natural sunlight or  light therapy in a medical office. Systemic therapy medicines. These medicines can help your body better manage skin cell turnover and inflammation. Medicines may be given as pills or injections. Biologic medicines. These medicines are usually given as injections or through an IV. These are helpful for many people with severe psoriasis, but there is an increased risk of infection. Follow these instructions at home: Skin care Moisturize your skin as needed. Only use moisturizers that your  health care provider says are okay. Apply cool, wet cloths (cold compresses) to the affected areas. This helps with itching. Do not use a hot tub or take hot showers. Take lukewarm showers and baths. Do not scratch your skin. Lifestyle Maintain a healthy weight. Eat a healthy diet that includes plenty of vegetables, fruits, whole grains, low-fat dairy products, and lean protein. Do not eat a lot of foods that are high in solid fats, added sugars, or salt. Use techniques for stress reduction, such as meditation or yoga. Do not use any products that contain nicotine or tobacco. These products include cigarettes, chewing tobacco, and vaping devices, such as e-cigarettes. If you need help quitting, ask your health care provider. Get safe exposure to the sun as told by your health care provider. This may include spending time outdoors in sunlight. Do not get sunburned. Consider joining a psoriasis support group. General instructions  Take or use over-the-counter and prescription medicines only as told by your health care provider. Keep a journal to help track what triggers an outbreak. Try to avoid any triggers. Do not drink alcohol if your health care provider tells you not to drink. See a mental health therapist if you feel sad, anxious, frustrated, and hopeless. Managing this condition can be challenging and you may feel overwhelmed and depressed. Keep all follow-up visits. Your health care provider may monitor your condition over time to make sure that it does not cause problems or get worse. Where to find support National Psoriasis Foundation: psoriasis.org Where to find more information American Academy of Dermatology: InfoExam.si Contact a health care provider if: You have a fever or chills. Your signs or symptoms gets worse. You have more redness or warmth in the affected areas. You have new or worsening pain or stiffness in your joints. Your nails break easily or pull away from the  nail bed. You feel depressed, frustrated, or hopeless about your condition. Get help right away if: You have thoughts of hurting yourself or others. Get help right away if you feel like you may hurt yourself or others, or have thoughts about taking your own life. Go to your nearest emergency room or: Call 911. Call the National Suicide Prevention Lifeline at (760)194-5354 or 988. This is open 24 hours a day. Text the Crisis Text Line at (726)105-9292. Summary Psoriasis is a long-term (chronic) skin condition that causes raised, red patches (plaques) to form on the skin. There is no cure for this condition, but treatment can help manage it. Treatment varies depending on the severity of your condition. Keep a journal to track what triggers an outbreak. Try to avoid any triggers. This information is not intended to replace advice given to you by your health care provider. Make sure you discuss any questions you have with your health care provider. Document Revised: 02/17/2022 Document Reviewed: 02/17/2022 Elsevier Patient Education  2024 [employer].

## 2023-07-06 NOTE — Progress Notes (Signed)
Acute Office Visit  Subjective:     Patient ID: Linda Robertson, female    DOB: [birth year], 52 y.o.   MRN: [id number]  Chief Complaint  Patient presents with   Rash    Patient c/o  rash in groin, outer thigh, axilla left and right, under bilatera eye lids, scalp- only itchy in axillae area- stats also has rash under bilateral breast but this feels different.  X several weeks. Requesting referral to Dermatology    HPI Patient is in today for rash that start in groin and upper/inner thigh and then noticed under breast and in bilateral armpits along with scalp.  Rash does not hurt and itches very little. She denies any fever, chills, body aches, joint pain. [place] has not found any ticks on her and checks daily. She has been a little more fatigued and hungry than usual. She was sick with a virus about 6-8 weeks ago and took abx and prednisone. She had reaction to prednisone(elevated BP/flushing) and stopped it after 2 days. All symptoms have resolved. 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 She has used aquafor with not much benefit on rash.   .. Active Ambulatory Problems    Diagnosis Date Noted   Right shoulder pain 11/29/2011   Left ankle injury 12/06/2011   Panic disorder 08/16/2014   Post-menopausal 03/16/2016   Vitamin D insufficiency 03/16/2016   Insomnia 03/16/2016   No energy 03/16/2016   Hot flashes 03/16/2016   Heel pain, bilateral 06/01/2016   Chest pain 07/14/2017   Atypical chest pain 08/11/2017   Left upper quadrant pain 01/23/2020   Constipation 01/23/2020   Bloating 01/23/2020   Abnormal weight gain 01/23/2020   Hyperlipidemia 01/24/2020   Hypertriglyceridemia 01/24/2020   Elevated liver enzymes 08/12/2020   RLS (restless legs syndrome) 08/26/2021   Overweight (BMI 25.0-29.9) 01/03/2023   Psoriasis (a type of skin inflammation) 07/06/2023   Elevated blood pressure reading 07/06/2023   Resolved Ambulatory  Problems    Diagnosis Date Noted   Dizziness 03/16/2016   COVID-19 virus infection 07/30/2020   SOB (shortness of breath) 08/12/2020   Acute respiratory disease due to COVID-19 virus 08/12/2020   Hyperkalemia 08/12/2020   Past Medical History:  Diagnosis Date   Anxiety     ROS  See HPI.     Objective:    BP (!) 142/53   Pulse (!) 58   Ht 5\' 6"  (1.676 m)   Wt 165 lb 8 oz (75.1 kg)   LMP 10/10/2016   SpO2 100%   BMI 26.71 kg/m  BP Readings from Last 3 Encounters:  07/06/23 (!) 142/53  01/03/23 (!) 135/55  04/30/22 135/62   Wt Readings from Last 3 Encounters:  07/06/23 165 lb 8 oz (75.1 kg)  01/03/23 169 lb (76.7 kg)  04/30/22 168 lb 1.6 oz (76.2 kg)      Physical Exam Well demarcated slight raised erythematous patches of skin with irregular borders over bilateral groin and inner thighs, under breast, bilateral axilla.   Scaly raised erythematous rash of scalp scattered along the hairline  Fine scales over eyelids, bilaterally.      Assessment & Plan:  Marland KitchenMarland KitchenHailea was seen today for rash.  Diagnoses and all orders for this visit:  Psoriasis (a type of skin inflammation) -     TSH -     COMPLETE METABOLIC PANEL WITH GFR -     CBC w/Diff/Platelet -  ANA Screen,IFA,Reflex Titer/Pattern,Reflex Mplx 11 Ab Cascade with IdentRA -     clobetasol cream (TEMOVATE) 0.05 %; Apply 1 Application topically 2 (two) times daily. As needed for psoriasis rash on skin. -     clobetasol (TEMOVATE) 0.05 % external solution; Apply 1 Application topically 2 (two) times daily. To apply to psoriasis rash in scalp. -     Ambulatory referral to Dermatology  Panic disorder -     ALPRAZolam (XANAX) 0.5 MG tablet; Take 1 tablet (0.5 mg total) by mouth daily as needed for anxiety.  Screening for lipid disorders -     Lipid Panel w/reflex Direct LDL  Screening for diabetes mellitus -     COMPLETE METABOLIC PANEL WITH GFR  Elevated blood pressure reading   Rash appears like  psoriasis rash unclear trigger, could be increase in stress  HO given  Start topical steroids, discussed SE Labs ordered  Referral to dermatology consider otezla, HO given.  Mood controlled  On zoloft Refilled xanax as needed  BP elevated today reports home readings 120s over 70s Discussed continue to check at home to make sure maintaining control     Return if symptoms worsen or fail to improve.  Tandy Gaw, PA-C

## 2023-07-07 NOTE — Progress Notes (Signed)
Linda Robertson,   White blood count looks good. Eosinophils normal. No sign of infection or allergic flare.  Thyroid looks great.   Your TG and LDL are elevated. 10 year CV risk still under 7.5 percent which is good.  HDL looks great.  I would start red yeast rice and fish oil over the counter. Limit carbs and fried foods.   Marland Kitchen.The 10-year ASCVD risk score (Arnett DK, et al., 2019) is: 2.3%   Values used to calculate the score:     Age: 52 years     Sex: Female     Is Non-Hispanic African American: No     Diabetic: No     Tobacco smoker: No     Systolic Blood Pressure: 142 mmHg     Is BP treated: No     HDL Cholesterol: 74 mg/dL     Total Cholesterol: 291 mg/dL  Protein up are you eating high protein diet?  ALT up could be due to holidays and alcohol?  Make sure taking vitamin D daily to help get calcium into bones.   Suggest repeat cmp in 3 months.

## 2023-07-10 LAB — COMPLETE METABOLIC PANEL WITH GFR
AG Ratio: 1.7 (calc) (ref 1.0–2.5)
ALT: 30 U/L — ABNORMAL HIGH (ref 6–29)
AST: 25 U/L (ref 10–35)
Albumin: 5.3 g/dL — ABNORMAL HIGH (ref 3.6–5.1)
Alkaline phosphatase (APISO): 85 U/L (ref 37–153)
BUN: 20 mg/dL (ref 7–25)
CO2: 22 mmol/L (ref 20–32)
Calcium: 10.5 mg/dL — ABNORMAL HIGH (ref 8.6–10.4)
Chloride: 101 mmol/L (ref 98–110)
Creat: 0.83 mg/dL (ref 0.50–1.03)
Globulin: 3.1 g/dL (calc) (ref 1.9–3.7)
Glucose, Bld: 82 mg/dL (ref 65–99)
Potassium: 4.4 mmol/L (ref 3.5–5.3)
Sodium: 138 mmol/L (ref 135–146)
Total Bilirubin: 0.4 mg/dL (ref 0.2–1.2)
Total Protein: 8.4 g/dL — ABNORMAL HIGH (ref 6.1–8.1)
eGFR: 84 mL/min/{1.73_m2} (ref >=60)

## 2023-07-10 LAB — ANA SCREEN,IFA,REFLEX TITER/PATTERN,REFLEX MPLX 11 AB CASCADE
Anti Nuclear Antibody (ANA): NEGATIVE
Cyclic Citrullin Peptide Ab: <16 UNITS
MUTATED CITRULLINATED VIMENTIN (MCV) AB: <20 U/mL (ref <20)
Rheumatoid fact SerPl-aCnc: <10 IU/mL (ref <14)

## 2023-07-10 LAB — CBC WITH DIFFERENTIAL/PLATELET
Absolute Monocytes: 947 cells/uL (ref 200–950)
Basophils Absolute: 46 cells/uL (ref 0–200)
Basophils Relative: 0.6 %
Eosinophils Absolute: 308 cells/uL (ref 15–500)
Eosinophils Relative: 4 %
HCT: 42.9 % (ref 35.0–45.0)
Hemoglobin: 14.5 g/dL (ref 11.7–15.5)
Lymphs Abs: 2649 cells/uL (ref 850–3900)
MCH: 32.1 pg (ref 27.0–33.0)
MCHC: 33.8 g/dL (ref 32.0–36.0)
MCV: 94.9 fL (ref 80.0–100.0)
MPV: 9.3 fL (ref 7.5–12.5)
Monocytes Relative: 12.3 %
Neutro Abs: 3750 cells/uL (ref 1500–7800)
Neutrophils Relative %: 48.7 %
Platelets: 327 10*3/uL (ref 140–400)
RBC: 4.52 10*6/uL (ref 3.80–5.10)
RDW: 12.7 % (ref 11.0–15.0)
Total Lymphocyte: 34.4 %
WBC: 7.7 10*3/uL (ref 3.8–10.8)

## 2023-07-10 LAB — LIPID PANEL W/REFLEX DIRECT LDL
Cholesterol: 291 mg/dL — ABNORMAL HIGH (ref <200)
HDL: 74 mg/dL (ref >=50)
LDL Cholesterol (Calc): 166 mg/dL (calc) — ABNORMAL HIGH
Non-HDL Cholesterol (Calc): 217 mg/dL (calc) — ABNORMAL HIGH (ref <130)
Total CHOL/HDL Ratio: 3.9 (calc) (ref <5.0)
Triglycerides: 331 mg/dL — ABNORMAL HIGH (ref <150)

## 2023-07-10 LAB — TSH: TSH: 1.71 mIU/L

## 2023-07-11 NOTE — Progress Notes (Signed)
ANA is negative.

## 2023-11-28 ENCOUNTER — Ambulatory Visit: Payer: Self-pay | Admitting: Physician Assistant

## 2023-11-28 NOTE — Telephone Encounter (Signed)
Copied from CRM [id number]. Topic: Clinical - Red Word Triage >> Nov 28, 2023 10:41 AM Prudencio Pair wrote: Red Word that prompted transfer to Nurse Triage: Patient slipped & fell on right knee a few days ago. Thinks it may be cracked. Painful to touch & also painful when bending. Can't really bend it. Scheduled appt with provider to be seen and have x-rays. Pain level stated is 9/10.   Chief Complaint: Knee pain after injury Symptoms: Pain 9/10 and numbness/tingling to kneecap when bends or touches knee Frequency: Intermittent Pertinent Negatives: Patient denies laceration/bleeding, current swelling, discoloration Disposition: [] ED /[] Urgent Care (no appt availability in office) / [x] Appointment(In office/virtual)/ []  [facility]/ [] Home Care/ [] Refused Recommended Disposition /[] [facility]/ []  Follow-up with PCP Additional Notes: Pt reporting that she fell directly onto right kneecap against a rock 5 days ago, which resulted in swelling to the area but no bruising or bleeding. Pt reporting that she feels 9/10 pain only when she bends the knee or touches the kneecap, "very painful like needles" and feels numbness and tingling "across the middle" of the kneecap when she bends it. Pt confirms she can walk, "not really having to take anything" for pain, has taken advil, used ice pack for swelling. Pt reporting no more swelling but that "it felt better when swollen," worsening pain. Pt reporting concern for a cracked kneecap, confirms she feels differences in shape of the knee. Pt was scheduled for appt tomorrow with PCP for visit with x-rays, offered to put pt on waitlist or find earlier appt, pt declines at this time. Advised pt go to ED or urgent care or call for sooner appt if worsening symptoms, pt declines at this time, "should be fine to wait" until tomorrow morning.   Reason for Disposition  [1] After 3 days AND [2] pain not improved  Answer Assessment - Initial Assessment  Questions 1. MECHANISM: "How did the injury happen?" (e.g., twisting injury, direct blow)      Patient states she fell 5 days ago[place], slipped and hit her right knee on rocks. 2. ONSET: "When did the injury happen?" (Minutes or hours ago)      5 days ago 3. LOCATION: "Where is the injury located?"      Right kneecap 4. APPEARANCE of INJURY: "What does the injury look like?"      No longer swollen, no discoloration or laceration 5. SEVERITY: "Can you put weight on that leg?" "Can you walk?"      Pt confirms she can walk, pain is 9/10 when she touches the kneecap or bends her knee, doing fine otherwise. Pt reporting that pain has worsened since swelling went down. 7. PAIN: "Is there pain?" If Yes, ask: "How bad is the pain?"  "What does it keep you from doing?" (e.g., Scale 1-10; or mild, moderate, severe)   -  NONE: (0): no pain   -  MILD (1-3): doesn't interfere with normal activities    -  MODERATE (4-7): interferes with normal activities (e.g., work or school) or awakens from sleep, limping    -  SEVERE (8-10): excruciating pain, unable to do any normal activities, unable to walk     9/10 pain when bends knee or touches kneecap, able to walk and perform daily activities otherwise, no trouble with pain at rest. Pt reporting she's taken some advil but "not really having to take anything." 9. OTHER SYMPTOMS: "Do you have any other symptoms?"  (  e.g., "pop" when knee injured, swelling, locking, buckling)      "Very painful like needles" when she touches her kneecap, numbness and tingling in kneecap "across the middle" when bends knee, pt confirms that the kneecap "doesn't feel normal" and feels differences in the shape of the knee.  Protocols used: Knee Injury-A-AH

## 2023-11-29 ENCOUNTER — Ambulatory Visit: Payer: Medicaid Other | Admitting: Physician Assistant

## 2023-11-29 ENCOUNTER — Encounter: Payer: Self-pay | Admitting: Physician Assistant

## 2023-11-29 ENCOUNTER — Ambulatory Visit: Payer: Medicaid Other

## 2023-11-29 DIAGNOSIS — E781 Pure hyperglyceridemia: Secondary | ICD-10-CM

## 2023-11-29 DIAGNOSIS — M7041 Prepatellar bursitis, right knee: Secondary | ICD-10-CM

## 2023-11-29 DIAGNOSIS — Z79899 Other long term (current) drug therapy: Secondary | ICD-10-CM

## 2023-11-29 DIAGNOSIS — M2391 Unspecified internal derangement of right knee: Secondary | ICD-10-CM | POA: Diagnosis not present

## 2023-11-29 DIAGNOSIS — M25561 Pain in right knee: Secondary | ICD-10-CM

## 2023-11-29 DIAGNOSIS — W108XXA Fall (on) (from) other stairs and steps, initial encounter: Secondary | ICD-10-CM

## 2023-11-29 MED ORDER — TRAMADOL HCL 50 MG PO TABS: 50.0000 mg | ORAL_TABLET | Freq: Three times a day (TID) | ORAL | 0 refills | Status: AC | PRN

## 2023-11-29 MED ORDER — MELOXICAM 15 MG PO TABS: 15.0000 mg | ORAL_TABLET | Freq: Every day | ORAL | 1 refills | Status: DC

## 2023-11-29 NOTE — Progress Notes (Signed)
    Procedures performed today:    None.  Independent interpretation of notes and tests performed by another provider:   None.  Brief History, Exam, Impression, and Recommendations:    Internal derangement of knee, right Called by Tandy Gaw, PA-C to evaluate a knee injury. Recent fall with increasing pain right knee, she did have immediate swelling. On exam she holds the knee flexed with about 5 to 10 degrees of extension lag. She has tenderness anteriorly over the prepatellar bursa consistent with a traumatic prepatellar bursitis. She also has some tenderness laterally. ACL, MCL, PCL, LCL all intact, negative McMurray's sign, she does have tenderness at the anterior horn of the lateral meniscus. She has pain with terminal flexion, combined with potential locking of the knee we do need MRI. NSAIDs and narcotics for pain/breakthrough pain. PCP will order the testing, I can see her back for follow-up.    ____________________________________________ Ihor Austin. Benjamin Stain, M.D., ABFM., CAQSM., AME. Primary Care and Sports Medicine [facility]  Adjunct Professor of Family Medicine  [employer]  Restaurant manager, fast food

## 2023-11-29 NOTE — Patient Instructions (Addendum)
Ice and use knee compression brace Will get MRI Mobic daily and tramadol for break through pain

## 2023-11-29 NOTE — Progress Notes (Unsigned)
   Acute Office Visit  Subjective:     Patient ID: Linda Robertson, female    DOB: 16-Oct-1971, 52 y.o.   MRN: [id number]  Chief Complaint  Patient presents with   Knee Pain    Rt knee paim after fall 6 days ago, after a fall on rock stairs     Knee Pain    Patient is in today for right knee pain after tripping over a root and falling onto rock stairs 6 days ago. There was swelling and bruising present which has gotten better but is still there. She can walk normally. She has taken Advil to try to help.   Review of Systems  Musculoskeletal:  Positive for falls and joint pain.        Objective:    LMP 10/10/2016  BP Readings from Last 3 Encounters:  07/06/23 (!) 142/53  01/03/23 (!) 135/55  04/30/22 135/62      Physical Exam Musculoskeletal:     Right knee: Swelling, effusion and ecchymosis present. No deformity, erythema or lacerations. Decreased range of motion (flexion limited due to pain; normal ROM with external and internal rotation and with extension). Tenderness present over the lateral joint line. No LCL laxity, MCL laxity, ACL laxity or PCL laxity.     Instability Tests: Anterior drawer test negative. Posterior drawer test negative. Anterior Lachman test negative. Medial McMurray test negative and lateral McMurray test negative.     Left knee: Normal. Bony tenderness: over patella.     Comments: Cannot keep right knee straight without pain.        Assessment & Plan:   Problem List Items Addressed This Visit   None Visit Diagnoses     Acute pain of right knee    -  Primary   Relevant Orders   DG Knee Complete 4 Views Right   Fall (on) (from) other stairs and steps, initial encounter          Inability to straighten knee without pain and effusion is worrisome for meniscal tear. Ordered stat right knee xray to rule out bony pathology. Stat xray showed: 1. No fracture or dislocation of the right knee. 2. Moderate nonspecific knee joint effusion. 3. Soft  tissue edema of the anterior knee. Ordered MRI Advised patient to use ice and knee sleeve in the meantime. Advised patient to limit weight bearing activity. Prescribed Meloxicam for everyday pain and inflammation relief. Prescribed Tramadol for breakthrough pain.   Declined all vaccines today. Advised patient to schedule mammogram with her knee MRI.  No orders of the defined types were placed in this encounter.   No follow-ups on file.  AnnaCollin M Mace, Student-PA

## 2023-11-29 NOTE — Assessment & Plan Note (Signed)
Called by Tandy Gaw, PA-C to evaluate a knee injury. Recent fall with increasing pain right knee, she did have immediate swelling. On exam she holds the knee flexed with about 5 to 10 degrees of extension lag. She has tenderness anteriorly over the prepatellar bursa consistent with a traumatic prepatellar bursitis. She also has some tenderness laterally. ACL, MCL, PCL, LCL all intact, negative McMurray's sign, she does have tenderness at the anterior horn of the lateral meniscus. She has pain with terminal flexion, combined with potential locking of the knee we do need MRI. NSAIDs and narcotics for pain/breakthrough pain. PCP will order the testing, I can see her back for follow-up.

## 2023-11-30 ENCOUNTER — Other Ambulatory Visit: Payer: Self-pay | Admitting: Physician Assistant

## 2023-11-30 ENCOUNTER — Encounter: Payer: Self-pay | Admitting: Physician Assistant

## 2023-11-30 DIAGNOSIS — W108XXA Fall (on) (from) other stairs and steps, initial encounter: Secondary | ICD-10-CM | POA: Insufficient documentation

## 2023-11-30 DIAGNOSIS — M25561 Pain in right knee: Secondary | ICD-10-CM | POA: Insufficient documentation

## 2023-11-30 DIAGNOSIS — M7041 Prepatellar bursitis, right knee: Secondary | ICD-10-CM | POA: Insufficient documentation

## 2023-11-30 LAB — LIPID PANEL
Chol/HDL Ratio: 5.5 {ratio} — ABNORMAL HIGH (ref 0.0–4.4)
Cholesterol, Total: 276 mg/dL — ABNORMAL HIGH (ref 100–199)
HDL: 50 mg/dL (ref >39)
LDL Chol Calc (NIH): 156 mg/dL — ABNORMAL HIGH (ref 0–99)
Triglycerides: 370 mg/dL — ABNORMAL HIGH (ref 0–149)
VLDL Cholesterol Cal: 70 mg/dL — ABNORMAL HIGH (ref 5–40)

## 2023-11-30 LAB — CMP14+EGFR
ALT: 24 [IU]/L (ref 0–32)
AST: 24 [IU]/L (ref 0–40)
Albumin: 4.8 g/dL (ref 3.8–4.9)
Alkaline Phosphatase: 103 [IU]/L (ref 44–121)
BUN/Creatinine Ratio: 13 (ref 9–23)
BUN: 12 mg/dL (ref 6–24)
Bilirubin Total: 0.6 mg/dL (ref 0.0–1.2)
CO2: 22 mmol/L (ref 20–29)
Calcium: 10.2 mg/dL (ref 8.7–10.2)
Chloride: 98 mmol/L (ref 96–106)
Creatinine, Ser: 0.9 mg/dL (ref 0.57–1.00)
Globulin, Total: 2.8 g/dL (ref 1.5–4.5)
Glucose: 92 mg/dL (ref 70–99)
Potassium: 4.5 mmol/L (ref 3.5–5.2)
Sodium: 139 mmol/L (ref 134–144)
Total Protein: 7.6 g/dL (ref 6.0–8.5)
eGFR: 76 mL/min/{1.73_m2} (ref >59)

## 2023-11-30 MED ORDER — ROSUVASTATIN CALCIUM 5 MG PO TABS: 5.0000 mg | ORAL_TABLET | Freq: Every day | ORAL | 1 refills | Status: DC

## 2023-11-30 NOTE — Progress Notes (Signed)
Linda Robertson,   Your cholesterol has not improved very much and TG and LDL still elevated. 10 year cardiovascular risk has increased a little more. I would start a statin to for risk reduction. Thoughts?   Marland KitchenMarland KitchenThe 10-year ASCVD risk score (Arnett DK, et al., 2019) is: 3.2%   Values used to calculate the score:     Age: 52 years     Sex: Female     Is Non-Hispanic African American: No     Diabetic: No     Tobacco smoker: No     Systolic Blood Pressure: 142 mmHg     Is BP treated: No     HDL Cholesterol: 50 mg/dL     Total Cholesterol: 276 mg/dL

## 2023-12-04 ENCOUNTER — Ambulatory Visit: Payer: Medicaid Other

## 2023-12-04 DIAGNOSIS — M25561 Pain in right knee: Secondary | ICD-10-CM

## 2023-12-04 DIAGNOSIS — M2391 Unspecified internal derangement of right knee: Secondary | ICD-10-CM

## 2023-12-04 DIAGNOSIS — W108XXA Fall (on) (from) other stairs and steps, initial encounter: Secondary | ICD-10-CM

## 2023-12-04 DIAGNOSIS — M7041 Prepatellar bursitis, right knee: Secondary | ICD-10-CM

## 2023-12-05 ENCOUNTER — Encounter: Payer: Self-pay | Admitting: Physician Assistant

## 2023-12-05 NOTE — Progress Notes (Signed)
GREAT news! No meniscal or ligament injury.  Significant bone contusion(bruise) and perhaps a small microfracture causing some swelling.  Wear a knee sleeve and continue treatment plan discussed at office visit.  Follow up with Dr. Karie Schwalbe in next week and if still having a lot of swelling it might could drain some of fluid off.

## 2023-12-07 MED ORDER — HYDROCHLOROTHIAZIDE 12.5 MG PO TABS: 12.5000 mg | ORAL_TABLET | Freq: Every day | ORAL | 1 refills | Status: DC

## 2024-01-12 ENCOUNTER — Ambulatory Visit: Payer: Medicaid Other

## 2024-01-19 ENCOUNTER — Ambulatory Visit: Payer: Medicaid Other

## 2024-01-23 ENCOUNTER — Other Ambulatory Visit: Payer: Self-pay | Admitting: Physician Assistant

## 2024-01-23 DIAGNOSIS — F410 Panic disorder [episodic paroxysmal anxiety] without agoraphobia: Secondary | ICD-10-CM

## 2024-01-25 ENCOUNTER — Other Ambulatory Visit: Payer: Self-pay

## 2024-01-25 ENCOUNTER — Ambulatory Visit (INDEPENDENT_AMBULATORY_CARE_PROVIDER_SITE_OTHER): Payer: Medicaid Other

## 2024-01-25 VITALS — BP 138/78 | HR 55

## 2024-01-25 DIAGNOSIS — R030 Elevated blood-pressure reading, without diagnosis of hypertension: Secondary | ICD-10-CM

## 2024-01-25 DIAGNOSIS — E781 Pure hyperglyceridemia: Secondary | ICD-10-CM | POA: Diagnosis not present

## 2024-01-25 DIAGNOSIS — F410 Panic disorder [episodic paroxysmal anxiety] without agoraphobia: Secondary | ICD-10-CM

## 2024-01-25 MED ORDER — HYDROCHLOROTHIAZIDE 25 MG PO TABS: 25.0000 mg | ORAL_TABLET | Freq: Every day | ORAL | 1 refills | Status: DC

## 2024-01-25 MED ORDER — ALPRAZOLAM 0.5 MG PO TABS: 0.5000 mg | ORAL_TABLET | Freq: Every day | ORAL | 5 refills | Status: DC | PRN

## 2024-01-25 NOTE — Telephone Encounter (Signed)
Last fill 07/06/23 last visit 11/29/23.  She also needs the hydrochlorothiazide 25 mg filled today.

## 2024-01-25 NOTE — Progress Notes (Signed)
   Established Patient Office Visit  Subjective   Patient ID: Linda Robertson, female    DOB: [birth year]  Age: 53 y.o. MRN: [id number]  Chief Complaint  Patient presents with   Hypertension   Hyperlipidemia    HPI  Linda Robertson is here for blood pressure check. She reports taking the hydrochlorothiazide 12.5 mg daily. Denies chest pain, shortness of breath or dizziness.   Hyperlipidemia - She is taking rosuvastatin daily.   ROS    Objective:     BP 138/78   Pulse (!) 55   LMP 10/10/2016   SpO2 99%    Physical Exam   No results found for any visits on 01/25/24.    The 10-year ASCVD risk score (Arnett DK, et al., 2019) is: 3%    Assessment & Plan:  Blood pressure check - Per Lesly Rubenstein, Patient advised to increase the hydrochlorothiazide to 25 mg daily.   Hyperlipid - follow up in 1 month.   Problem List Items Addressed This Visit       Unprioritized   Hypertriglyceridemia   Elevated blood pressure reading - Primary    Return in about 1 month (around 02/24/2024) for with [facility] for blood pressure check and hyperlipid. Earna Coder, Janalyn Harder, CMA

## 2024-02-16 ENCOUNTER — Other Ambulatory Visit: Payer: Self-pay | Admitting: Physician Assistant

## 2024-02-24 ENCOUNTER — Ambulatory Visit: Payer: Medicaid Other | Admitting: Physician Assistant

## 2024-02-28 ENCOUNTER — Ambulatory Visit: Payer: Medicaid Other | Admitting: Physician Assistant

## 2024-03-06 ENCOUNTER — Encounter: Payer: Self-pay | Admitting: Physician Assistant

## 2024-03-06 ENCOUNTER — Ambulatory Visit: Admitting: Physician Assistant

## 2024-03-06 VITALS — BP 123/68 | HR 54 | Ht 66.0 in | Wt 165.0 lb

## 2024-03-06 DIAGNOSIS — F5101 Primary insomnia: Secondary | ICD-10-CM

## 2024-03-06 DIAGNOSIS — E781 Pure hyperglyceridemia: Secondary | ICD-10-CM

## 2024-03-06 DIAGNOSIS — I10 Essential (primary) hypertension: Secondary | ICD-10-CM

## 2024-03-06 DIAGNOSIS — E782 Mixed hyperlipidemia: Secondary | ICD-10-CM | POA: Diagnosis not present

## 2024-03-06 DIAGNOSIS — F410 Panic disorder [episodic paroxysmal anxiety] without agoraphobia: Secondary | ICD-10-CM | POA: Diagnosis not present

## 2024-03-06 DIAGNOSIS — Z79899 Other long term (current) drug therapy: Secondary | ICD-10-CM

## 2024-03-06 DIAGNOSIS — M2391 Unspecified internal derangement of right knee: Secondary | ICD-10-CM

## 2024-03-06 DIAGNOSIS — G479 Sleep disorder, unspecified: Secondary | ICD-10-CM

## 2024-03-06 MED ORDER — SERTRALINE HCL 50 MG PO TABS: ORAL_TABLET | ORAL | 3 refills | Status: AC

## 2024-03-06 NOTE — Progress Notes (Signed)
 Established Patient Office Visit  Subjective   Patient ID: Linda Robertson, female    DOB: 07-Dec-1971  Age: 53 y.o. MRN: [id number]  CC: one month follow up  HPI Patient is a 53 yo female with HTN and dyslipidemia who presents for a one month follow up.   She is not checking her BP at home. She is compliant with all medications - hydrochlorothiazide 25mg  and rosuvastatin 5mg . She denies any chest pain, SOB, headaches, or dizziness.   She takes trazodone as needed for insomnia. She has plenty of medication.   Right knee pain improving. 90 percent better with no concerns.   .. Active Ambulatory Problems    Diagnosis Date Noted   Right shoulder pain 11/29/2011   Left ankle injury 12/06/2011   Panic disorder 08/16/2014   Post-menopausal 03/16/2016   Vitamin D insufficiency 03/16/2016   Insomnia 03/16/2016   No energy 03/16/2016   Hot flashes 03/16/2016   Heel pain, bilateral 06/01/2016   Chest pain 07/14/2017   Atypical chest pain 08/11/2017   Left upper quadrant pain 01/23/2020   Constipation 01/23/2020   Bloating 01/23/2020   Abnormal weight gain 01/23/2020   Hyperlipidemia 01/24/2020   Hypertriglyceridemia 01/24/2020   Elevated liver enzymes 08/12/2020   RLS (restless legs syndrome) 08/26/2021   Overweight (BMI 25.0-29.9) 01/03/2023   Psoriasis (a type of skin inflammation) 07/06/2023   Elevated blood pressure reading 07/06/2023   Internal derangement of knee, right 11/29/2023   Prepatellar bursitis of right knee 11/30/2023   Acute pain of right knee 11/30/2023   Fall (on) (from) other stairs and steps, initial encounter 11/30/2023   Primary hypertension 03/07/2024   Resolved Ambulatory Problems    Diagnosis Date Noted   Dizziness 03/16/2016   COVID-19 virus infection 07/30/2020   SOB (shortness of breath) 08/12/2020   Acute respiratory disease due to COVID-19 virus 08/12/2020   Hyperkalemia 08/12/2020   Past Medical History:  Diagnosis Date   Anxiety      ROS See HPI   Objective:    BP 123/68 (BP Location: Left Arm, Patient Position: Sitting, Cuff Size: Normal)   Pulse (!) 54   Ht 5\' 6"  (1.676 m)   Wt 165 lb (74.8 kg)   LMP 10/10/2016   SpO2 97%   BMI 26.63 kg/m  BP Readings from Last 3 Encounters:  03/06/24 123/68  01/25/24 138/78  07/06/23 (!) 142/53   Wt Readings from Last 3 Encounters:  03/06/24 165 lb (74.8 kg)  07/06/23 165 lb 8 oz (75.1 kg)  01/03/23 169 lb (76.7 kg)    ..    03/07/2024    6:54 AM 11/29/2023   11:08 AM 01/03/2023    9:50 AM 04/30/2022    9:58 AM 08/26/2021    8:43 AM  Depression screen PHQ 2/9  Decreased Interest 0 0 0 0 0  Down, Depressed, Hopeless 0 0 0 0 0  PHQ - 2 Score 0 0 0 0 0  Altered sleeping 0  1 0 1  Tired, decreased energy 0  0 0 0  Change in appetite 0  3 0 1  Feeling bad or failure about yourself    0 0 0  Trouble concentrating 0  0 0 0  Moving slowly or fidgety/restless 0  0 0 0  Suicidal thoughts 0  0 0 0  PHQ-9 Score 0  4 0 2  Difficult doing work/chores Not difficult at all  Not difficult at all  Not difficult at all   .Marland Kitchen  03/07/2024    6:54 AM 01/03/2023    9:51 AM 04/30/2022    9:59 AM 08/26/2021    8:43 AM  GAD 7 : Generalized Anxiety Score  Nervous, Anxious, on Edge 0 0 0 0  Control/stop worrying 0 0 0 0  Worry too much - different things 0 0 0 0  Trouble relaxing 0 0 0 1  Restless 0 0 0 0  Easily annoyed or irritable 0 0 0 0  Afraid - awful might happen 0 0 0 0  Total GAD 7 Score 0 0 0 1  Anxiety Difficulty Not difficult at all Not difficult at all  Not difficult at all      Physical Exam Constitutional:      Appearance: Normal appearance.  HENT:     Head: Normocephalic and atraumatic.  Cardiovascular:     Rate and Rhythm: Normal rate and regular rhythm.     Pulses: Normal pulses.     Heart sounds: Normal heart sounds.  Pulmonary:     Effort: Pulmonary effort is normal.     Breath sounds: Normal breath sounds.  Skin:    General: Skin is warm.   Neurological:     General: No focal deficit present.     Mental Status: She is alert.  Psychiatric:        Mood and Affect: Mood normal.        Behavior: Behavior normal.        Assessment & Plan:  Marland KitchenMarland KitchenLeatrice was seen today for medical management of chronic issues.  Diagnoses and all orders for this visit:  Primary hypertension -     CMP14+EGFR -     hydrochlorothiazide (HYDRODIURIL) 25 MG tablet; Take 1 tablet (25 mg total) by mouth daily.  Panic disorder -     sertraline (ZOLOFT) 50 MG tablet; TAKE 1 AND 1/2 TABLET BY MOUTH DAILY  Mixed hyperlipidemia -     Lipid panel  Hypertriglyceridemia -     Lipid panel  Medication management -     Lipid panel -     CMP14+EGFR -     TSH + free T4 -     CBC w/Diff/Platelet  Primary insomnia   Vitals look great today Continue on hydrochlorothiazide daily Will recheck fasting labs Continue on crestor for cholesterol Continue trazodone as needed for sleep PHQ/GAD no concerns Continue on zoloft and xanax Right knee pain 90 percent better Follow up in 6 months     Tandy Gaw, PA-C

## 2024-03-07 ENCOUNTER — Encounter: Payer: Self-pay | Admitting: Physician Assistant

## 2024-03-07 DIAGNOSIS — I10 Essential (primary) hypertension: Secondary | ICD-10-CM | POA: Insufficient documentation

## 2024-03-07 LAB — CMP14+EGFR
ALT: 28 IU/L (ref 0–32)
AST: 24 IU/L (ref 0–40)
Albumin: 5.1 g/dL — ABNORMAL HIGH (ref 3.8–4.9)
Alkaline Phosphatase: 89 IU/L (ref 44–121)
BUN/Creatinine Ratio: 21 (ref 9–23)
BUN: 15 mg/dL (ref 6–24)
Bilirubin Total: 0.5 mg/dL (ref 0.0–1.2)
CO2: 23 mmol/L (ref 20–29)
Calcium: 10.5 mg/dL — ABNORMAL HIGH (ref 8.7–10.2)
Chloride: 99 mmol/L (ref 96–106)
Creatinine, Ser: 0.72 mg/dL (ref 0.57–1.00)
Globulin, Total: 2.9 g/dL (ref 1.5–4.5)
Glucose: 97 mg/dL (ref 70–99)
Potassium: 5.1 mmol/L (ref 3.5–5.2)
Sodium: 139 mmol/L (ref 134–144)
Total Protein: 8 g/dL (ref 6.0–8.5)
eGFR: 100 mL/min/{1.73_m2} (ref >59)

## 2024-03-07 LAB — CBC WITH DIFFERENTIAL/PLATELET
Basophils Absolute: 0 10*3/uL (ref 0.0–0.2)
Basos: 0 %
EOS (ABSOLUTE): 0.2 10*3/uL (ref 0.0–0.4)
Eos: 3 %
Hematocrit: 42 % (ref 34.0–46.6)
Hemoglobin: 14.3 g/dL (ref 11.1–15.9)
Immature Grans (Abs): 0 10*3/uL (ref 0.0–0.1)
Immature Granulocytes: 0 %
Lymphocytes Absolute: 2.8 10*3/uL (ref 0.7–3.1)
Lymphs: 37 %
MCH: 32.4 pg (ref 26.6–33.0)
MCHC: 34 g/dL (ref 31.5–35.7)
MCV: 95 fL (ref 79–97)
Monocytes Absolute: 0.8 10*3/uL (ref 0.1–0.9)
Monocytes: 10 %
Neutrophils Absolute: 3.8 10*3/uL (ref 1.4–7.0)
Neutrophils: 50 %
Platelets: 347 10*3/uL (ref 150–450)
RBC: 4.42 x10E6/uL (ref 3.77–5.28)
RDW: 12.3 % (ref 11.7–15.4)
WBC: 7.7 10*3/uL (ref 3.4–10.8)

## 2024-03-07 LAB — LIPID PANEL
Chol/HDL Ratio: 3 ratio (ref 0.0–4.4)
Cholesterol, Total: 189 mg/dL (ref 100–199)
HDL: 64 mg/dL (ref >39)
LDL Chol Calc (NIH): 85 mg/dL (ref 0–99)
Triglycerides: 242 mg/dL — ABNORMAL HIGH (ref 0–149)
VLDL Cholesterol Cal: 40 mg/dL (ref 5–40)

## 2024-03-07 LAB — TSH+FREE T4
Free T4: 1.15 ng/dL (ref 0.82–1.77)
TSH: 1.35 u[IU]/mL (ref 0.450–4.500)

## 2024-03-07 MED ORDER — HYDROCHLOROTHIAZIDE 25 MG PO TABS: 25.0000 mg | ORAL_TABLET | Freq: Every day | ORAL | 1 refills | Status: DC

## 2024-03-07 NOTE — Progress Notes (Signed)
 Patryce,   Thyroid looks wonderful.  Kidney looks great.  LDL SOOO much better. Stay on crestor! Do you need refills?  TG still elevated. Take OTC fish oil.  Albumin a little elevated. Cut back on protein.  Calcium a little elevated too. Don't take any extra calcium.

## 2024-06-06 ENCOUNTER — Other Ambulatory Visit: Payer: Self-pay | Admitting: Physician Assistant

## 2024-06-06 DIAGNOSIS — E782 Mixed hyperlipidemia: Secondary | ICD-10-CM

## 2024-07-20 ENCOUNTER — Other Ambulatory Visit: Payer: Self-pay | Admitting: Physician Assistant

## 2024-07-20 DIAGNOSIS — F410 Panic disorder [episodic paroxysmal anxiety] without agoraphobia: Secondary | ICD-10-CM

## 2024-09-25 ENCOUNTER — Other Ambulatory Visit: Payer: Self-pay | Admitting: Family Medicine

## 2024-09-25 DIAGNOSIS — F410 Panic disorder [episodic paroxysmal anxiety] without agoraphobia: Secondary | ICD-10-CM

## 2024-11-06 ENCOUNTER — Telehealth: Payer: Self-pay

## 2024-11-06 DIAGNOSIS — Z1231 Encounter for screening mammogram for malignant neoplasm of breast: Secondary | ICD-10-CM

## 2024-11-06 NOTE — Telephone Encounter (Signed)
 Done

## 2024-11-06 NOTE — Telephone Encounter (Signed)
 Last Mammogram showing in health maintenance tab as patient reported on 01/01/20214  Pended order

## 2024-11-06 NOTE — Telephone Encounter (Signed)
 Copied from CRM [id number]. Topic: Clinical - Request for Lab/Test Order >> Nov 06, 2024  9:52 AM Linda Robertson wrote: Reason for CRM: pt wants mammogram orders

## 2024-11-09 NOTE — Telephone Encounter (Signed)
 Patient informed and will call to schedule this mammogram appointment.

## 2024-11-28 ENCOUNTER — Encounter: Admitting: Physician Assistant

## 2024-11-29 ENCOUNTER — Ambulatory Visit

## 2024-11-30 ENCOUNTER — Ambulatory Visit

## 2024-11-30 ENCOUNTER — Ambulatory Visit: Admitting: Physician Assistant

## 2024-11-30 VITALS — BP 120/70 | HR 61 | Ht 66.0 in | Wt 162.0 lb

## 2024-11-30 DIAGNOSIS — I10 Essential (primary) hypertension: Secondary | ICD-10-CM | POA: Diagnosis not present

## 2024-11-30 DIAGNOSIS — Z0000 Encounter for general adult medical examination without abnormal findings: Secondary | ICD-10-CM

## 2024-11-30 DIAGNOSIS — F410 Panic disorder [episodic paroxysmal anxiety] without agoraphobia: Secondary | ICD-10-CM | POA: Diagnosis not present

## 2024-11-30 DIAGNOSIS — Z131 Encounter for screening for diabetes mellitus: Secondary | ICD-10-CM | POA: Diagnosis not present

## 2024-11-30 DIAGNOSIS — Z1231 Encounter for screening mammogram for malignant neoplasm of breast: Secondary | ICD-10-CM

## 2024-11-30 MED ORDER — HYDROCHLOROTHIAZIDE 25 MG PO TABS: 25.0000 mg | ORAL_TABLET | Freq: Every day | ORAL | 3 refills | Status: AC

## 2024-11-30 MED ORDER — ALPRAZOLAM 0.5 MG PO TABS: 0.5000 mg | ORAL_TABLET | Freq: Every evening | ORAL | 1 refills | Status: AC | PRN

## 2024-11-30 NOTE — Patient Instructions (Signed)

## 2024-12-01 LAB — CMP14+EGFR
ALT: 25 IU/L (ref 0–32)
AST: 23 IU/L (ref 0–40)
Albumin: 4.9 g/dL (ref 3.8–4.9)
Alkaline Phosphatase: 86 IU/L (ref 49–135)
BUN/Creatinine Ratio: 20 (ref 9–23)
BUN: 16 mg/dL (ref 6–24)
Bilirubin Total: 0.5 mg/dL (ref 0.0–1.2)
CO2: 24 mmol/L (ref 20–29)
Calcium: 10 mg/dL (ref 8.7–10.2)
Chloride: 100 mmol/L (ref 96–106)
Creatinine, Ser: 0.82 mg/dL (ref 0.57–1.00)
Globulin, Total: 2.7 g/dL (ref 1.5–4.5)
Glucose: 86 mg/dL (ref 70–99)
Potassium: 4.5 mmol/L (ref 3.5–5.2)
Sodium: 138 mmol/L (ref 134–144)
Total Protein: 7.6 g/dL (ref 6.0–8.5)
eGFR: 85 mL/min/1.73 (ref >59)

## 2024-12-01 LAB — LIPID PANEL
Chol/HDL Ratio: 3.5 ratio (ref 0.0–4.4)
Cholesterol, Total: 191 mg/dL (ref 100–199)
HDL: 54 mg/dL (ref >39)
LDL Chol Calc (NIH): 104 mg/dL — ABNORMAL HIGH (ref 0–99)
Triglycerides: 195 mg/dL — ABNORMAL HIGH (ref 0–149)
VLDL Cholesterol Cal: 33 mg/dL (ref 5–40)

## 2024-12-03 ENCOUNTER — Ambulatory Visit: Payer: Self-pay | Admitting: Physician Assistant

## 2024-12-03 ENCOUNTER — Encounter: Payer: Self-pay | Admitting: Physician Assistant

## 2024-12-03 ENCOUNTER — Other Ambulatory Visit: Payer: Self-pay | Admitting: Physician Assistant

## 2024-12-03 DIAGNOSIS — Z01810 Encounter for preprocedural cardiovascular examination: Secondary | ICD-10-CM

## 2024-12-03 DIAGNOSIS — E782 Mixed hyperlipidemia: Secondary | ICD-10-CM

## 2024-12-03 DIAGNOSIS — F410 Panic disorder [episodic paroxysmal anxiety] without agoraphobia: Secondary | ICD-10-CM

## 2024-12-03 MED ORDER — ROSUVASTATIN CALCIUM 5 MG PO TABS: 5.0000 mg | ORAL_TABLET | Freq: Every day | ORAL | 1 refills | Status: AC

## 2024-12-03 NOTE — Progress Notes (Signed)
 Sharian,   Kidney, liver, glucose look GREAT.  TG still a little elevated but better than 9 months ago and LDL not to goal.  Are you taking crestor  and Lovaza ?

## 2024-12-03 NOTE — Progress Notes (Signed)
 Ok stay on current medications. Work on heart healthy diet. Recheck in 1 year.

## 2024-12-03 NOTE — Progress Notes (Signed)
 Complete physical exam  Patient: Linda Robertson   DOB: 06-11-1971   53 y.o. Female  MRN: [id number]  Subjective:    Chief Complaint  Patient presents with   Annual Exam   ..Discussed the use of AI scribe software for clinical note transcription with the patient, who gave verbal consent to proceed.  History of Present Illness Linda Robertson is a 53 year old female who presents for annual complete physical.   Mood disturbance - Mood is 'pretty good' and stable on sertraline  (Zoloft ) with no need for refills at this time - Increased feelings of being 'a little more overwhelmed than usual' attributed to multiple life events including upcoming marriage in April, daughter's engagement, house construction, and a new puppy  Sleep disturbance and restless legs - Occasionally uses trazodone  for sleep but experiences next-day grogginess - Magnesium glycinate provides some relief for restless legs - Prefers natural remedies; melatonin was ineffective  Breast implant removal and breast health - Plans to have breast implants removed in January with no intention to replace them - Recently completed a mammogram  Cardiovascular risk factors and medication management - Continues to take Lovaza , rosuvastatin , and hydrochlorothiazide  - Monitors blood pressure at home a couple of times per week - No chest pain or shortness of breath  Lifestyle modification and nutrition - Currently fasting - Has increased exercise, though limited by busy schedule and cold weather - Changed nutrition plan with goal of fitting into wedding dress  Gastrointestinal and musculoskeletal symptoms - No abdominal pain - Bowel movements are regular - Mild knee tenderness, manageable  Preventive health maintenance - Up to date with colonoscopy - No recent vaccines     Most recent fall risk assessment:    12/03/2024    6:55 AM  Fall Risk   Falls in the past year? 0  Number falls in past yr: 0  Injury with  Fall? 0  Follow up Falls evaluation completed     Most recent depression screenings:    03/07/2024    6:54 AM 11/29/2023   11:08 AM  PHQ 2/9 Scores  PHQ - 2 Score 0 0  PHQ- 9 Score 0       Data saved with a previous flowsheet row definition    Vision:Within last year and Dental: No current dental problems and Receives regular dental care  Patient Active Problem List   Diagnosis Date Noted   Primary hypertension 03/07/2024   Prepatellar bursitis of right knee 11/30/2023   Acute pain of right knee 11/30/2023   Fall (on) (from) other stairs and steps, initial encounter 11/30/2023   Internal derangement of knee, right 11/29/2023   Psoriasis (a type of skin inflammation) 07/06/2023   Elevated blood pressure reading 07/06/2023   Overweight (BMI 25.0-29.9) 01/03/2023   RLS (restless legs syndrome) 08/26/2021   Elevated liver enzymes 08/12/2020   Hyperlipidemia 01/24/2020   Hypertriglyceridemia 01/24/2020   Left upper quadrant pain 01/23/2020   Constipation 01/23/2020   Bloating 01/23/2020   Abnormal weight gain 01/23/2020   Atypical chest pain 08/11/2017   Chest pain 07/14/2017   Heel pain, bilateral 06/01/2016   Post-menopausal 03/16/2016   Vitamin D  insufficiency 03/16/2016   Insomnia 03/16/2016   No energy 03/16/2016   Hot flashes 03/16/2016   Panic disorder 08/16/2014   Left ankle injury 12/06/2011   Right shoulder pain 11/29/2011   Past Medical History:  Diagnosis Date   Anxiety    Past Surgical History:  Procedure Laterality Date  APPENDECTOMY     BREAST ENHANCEMENT SURGERY     Family History  Problem Relation Age of Onset   Heart attack Father    Sudden death Father 28   Heart failure Father    Heart attack Maternal Grandmother    Congestive Heart Failure Maternal Grandmother    Atrial fibrillation Mother    Heart attack Maternal Grandfather    Heart attack Paternal Grandmother    Diabetes Neg Hx    Hyperlipidemia Neg Hx    Colon cancer Neg Hx     Colon polyps Neg Hx    Esophageal cancer Neg Hx    Rectal cancer Neg Hx    Stomach cancer Neg Hx    Allergies  Allergen Reactions   Codeine Itching   Prednisone Nausea Only    Nause, dizziness,  and elevated BP       Patient Care Team: Beverlee Wilmarth L, PA-C as PCP - General (Family Medicine)   Outpatient Medications Prior to Visit  Medication Sig   clobetasol  (TEMOVATE ) 0.05 % external solution Apply 1 Application topically 2 (two) times daily. To apply to psoriasis rash in scalp.   omega-3 acid ethyl esters (LOVAZA ) 1 g capsule TAKE TWO CAPSULES BY MOUTH TWICE DAILY.   rosuvastatin  (CRESTOR ) 5 MG tablet TAKE ONE TABLET BY MOUTH DAILY   sertraline  (ZOLOFT ) 50 MG tablet TAKE 1 AND 1/2 TABLET BY MOUTH DAILY   traZODone  (DESYREL ) 50 MG tablet Take 0.5-1 tablets (25-50 mg total) by mouth at bedtime as needed for sleep.   [DISCONTINUED] ALPRAZolam  (XANAX ) 0.5 MG tablet TAKE ONE TABLET BY MOUTH DAILY AS NEEDED FOR ANXIETY   [DISCONTINUED] hydrochlorothiazide  (HYDRODIURIL ) 25 MG tablet Take 1 tablet (25 mg total) by mouth daily.   No facility-administered medications prior to visit.    Review of Systems  All other systems reviewed and are negative.         Objective:     BP 120/70   Pulse 61   Ht 5' 6 (1.676 m)   Wt 162 lb (73.5 kg)   LMP 10/10/2016   SpO2 99%   BMI 26.15 kg/m  BP Readings from Last 3 Encounters:  11/30/24 120/70  03/06/24 123/68  01/25/24 138/78   Wt Readings from Last 3 Encounters:  11/30/24 162 lb (73.5 kg)  03/06/24 165 lb (74.8 kg)  07/06/23 165 lb 8 oz (75.1 kg)      Physical Exam  BP 120/70   Pulse 61   Ht 5' 6 (1.676 m)   Wt 162 lb (73.5 kg)   LMP 10/10/2016   SpO2 99%   BMI 26.15 kg/m   General Appearance:    Alert, cooperative, no distress, appears stated age  Head:    Normocephalic, without obvious abnormality, atraumatic  Eyes:    PERRL, conjunctiva/corneas clear, EOM's intact, fundi    benign, both eyes  Ears:     Normal TM's and external ear canals, both ears  Nose:   Nares normal, septum midline, mucosa normal, no drainage    or sinus tenderness  Throat:   Lips, mucosa, and tongue normal; teeth and gums normal  Neck:   Supple, symmetrical, trachea midline, no adenopathy;    thyroid:  no enlargement/tenderness/nodules; no carotid   bruit or JVD  Back:     Symmetric, no curvature, ROM normal, no CVA tenderness  Lungs:     Clear to auscultation bilaterally, respirations unlabored  Chest Wall:    No tenderness or deformity   Heart:  Regular rate and rhythm, S1 and S2 normal, no murmur, rub   or gallop     Abdomen:     Soft, non-tender, bowel sounds active all four quadrants,    no masses, no organomegaly        Extremities:   Extremities normal, atraumatic, no cyanosis or edema  Pulses:   2+ and symmetric all extremities  Skin:   Skin color, texture, turgor normal, no rashes or lesions  Lymph nodes:   Cervical, supraclavicular, and axillary nodes normal  Neurologic:   CNII-XII intact, normal strength, sensation and reflexes    throughout        Assessment & Plan:    Routine Health Maintenance and Physical Exam  Immunization History  Administered Date(s) Administered   Tdap 07/16/2017    Health Maintenance  Topic Date Due   Mammogram  12/27/2013   COVID-19 Vaccine (1) 12/16/2024 (Originally 06/21/1976)   Zoster Vaccines- Shingrix (1 of 2) 02/28/2025 (Originally 06/20/1989)   Influenza Vaccine  03/26/2025 (Originally 07/27/2024)   Pneumococcal Vaccine: 50+ Years (1 of 1 - PCV) 11/30/2025 (Originally 06/20/2020)   Hepatitis B Vaccines 19-59 Average Risk (1 of 3 - 19+ 3-dose series) 11/30/2025 (Originally 06/20/1989)   Hepatitis C Screening  11/30/2025 (Originally 06/20/1988)   DTaP/Tdap/Td (2 - Td or Tdap) 07/17/2027   Cervical Cancer Screening (HPV/Pap Cotest)  01/04/2028   Colonoscopy  04/16/2031   HIV Screening  Completed   HPV VACCINES  Aged Out   Meningococcal B Vaccine  Aged Out     Discussed health benefits of physical activity, and encouraged her to engage in regular exercise appropriate for her age and condition. SABRAEzekiel was seen today for annual exam.  Diagnoses and all orders for this visit:  Encounter for annual physical exam -     Lipid panel -     CMP14+EGFR  Panic disorder -     ALPRAZolam  (XANAX ) 0.5 MG tablet; Take 1 tablet (0.5 mg total) by mouth at bedtime as needed for anxiety. -     CMP14+EGFR  Primary hypertension -     hydrochlorothiazide  (HYDRODIURIL ) 25 MG tablet; Take 1 tablet (25 mg total) by mouth daily. -     CMP14+EGFR  Screening for diabetes mellitus -     CMP14+EGFR    Assessment & Plan Adult Wellness Visit Routine wellness visit with recent mammogram and up-to-date colonoscopy. Discussed weight management and glucose control. - Continue current health maintenance practices. - Encouraged walking post-meals to manage glucose levels. - Recommended vitamin D  supplementation for bone health.  Essential hypertension Blood pressure slightly elevated. On hydrochlorothiazide . Infrequent home monitoring. - Rechecked blood pressure and looked great!  - Encouraged regular home blood pressure monitoring.  Depression Well-managed on Zoloft . Mood stable. PHQ to goal.  - Continue Zoloft  as prescribed.  Panic disorder Increased anxiety due to upcoming events. Requested Xanax . - Prescribed Xanax  for anxiety management.    Return in about 1 year (around 11/30/2025).     Yaneli Keithley, PA-C

## 2024-12-10 ENCOUNTER — Ambulatory Visit: Payer: Self-pay | Admitting: Physician Assistant

## 2024-12-10 NOTE — Progress Notes (Signed)
 Normal mammogram. Follow up in one year.

## 2025-01-01 NOTE — Addendum Note (Signed)
 Addended byBETHA DUWAINE RIGGS on: 01/01/2025 02:49 PM   Modules accepted: Orders

## 2025-01-04 LAB — CBC WITH DIFFERENTIAL/PLATELET
Basophils Absolute: 0 x10E3/uL (ref 0.0–0.2)
Basos: 0 %
EOS (ABSOLUTE): 0.2 x10E3/uL (ref 0.0–0.4)
Eos: 2 %
Hematocrit: 39.6 % (ref 34.0–46.6)
Hemoglobin: 12.8 g/dL (ref 11.1–15.9)
Immature Grans (Abs): 0 x10E3/uL (ref 0.0–0.1)
Immature Granulocytes: 0 %
Lymphocytes Absolute: 4.1 x10E3/uL — ABNORMAL HIGH (ref 0.7–3.1)
Lymphs: 39 %
MCH: 30.8 pg (ref 26.6–33.0)
MCHC: 32.3 g/dL (ref 31.5–35.7)
MCV: 95 fL (ref 79–97)
Monocytes Absolute: 1.2 x10E3/uL — ABNORMAL HIGH (ref 0.1–0.9)
Monocytes: 11 %
Neutrophils Absolute: 5.1 x10E3/uL (ref 1.4–7.0)
Neutrophils: 48 %
Platelets: 372 x10E3/uL (ref 150–450)
RBC: 4.15 x10E6/uL (ref 3.77–5.28)
RDW: 12.4 % (ref 11.7–15.4)
WBC: 10.5 x10E3/uL (ref 3.4–10.8)

## 2025-01-04 NOTE — Progress Notes (Signed)
 No concerns on CBC. Does this need to be faxed anywhere to get surgical clearance?

## 2025-01-08 ENCOUNTER — Encounter: Payer: Self-pay | Admitting: Physician Assistant
